# Patient Record
Sex: Female | Born: 1980
Health system: Southern US, Community
[De-identification: ages and names within clinical notes are randomized; demographics above are authoritative.]

## PROBLEM LIST (undated history)

## (undated) ENCOUNTER — Emergency Department (HOSPITAL_COMMUNITY): Admission: EM | Payer: Self-pay | Source: Home / Self Care

## (undated) DIAGNOSIS — O009 Unspecified ectopic pregnancy without intrauterine pregnancy: Secondary | ICD-10-CM

## (undated) DIAGNOSIS — J45909 Unspecified asthma, uncomplicated: Secondary | ICD-10-CM

## (undated) DIAGNOSIS — E669 Obesity, unspecified: Secondary | ICD-10-CM

## (undated) HISTORY — PX: SALPINGECTOMY: SHX328

## (undated) HISTORY — DX: Obesity, unspecified: E66.9

---

## 2011-03-27 DIAGNOSIS — O009 Unspecified ectopic pregnancy without intrauterine pregnancy: Secondary | ICD-10-CM

## 2011-03-27 HISTORY — DX: Unspecified ectopic pregnancy without intrauterine pregnancy: O00.90

## 2013-02-05 ENCOUNTER — Emergency Department (HOSPITAL_COMMUNITY)
Admission: EM | Admit: 2013-02-05 | Discharge: 2013-02-05 | Disposition: A | Discharge status: Home / Self Care | Payer: No Typology Code available for payment source | Attending: Emergency Medicine | Admitting: Emergency Medicine

## 2013-02-05 ENCOUNTER — Encounter (HOSPITAL_COMMUNITY): Payer: Self-pay | Admitting: Emergency Medicine

## 2013-02-05 ENCOUNTER — Emergency Department (HOSPITAL_COMMUNITY): Payer: No Typology Code available for payment source

## 2013-02-05 DIAGNOSIS — J45909 Unspecified asthma, uncomplicated: Secondary | ICD-10-CM | POA: Insufficient documentation

## 2013-02-05 DIAGNOSIS — Y92410 Unspecified street and highway as the place of occurrence of the external cause: Secondary | ICD-10-CM | POA: Insufficient documentation

## 2013-02-05 DIAGNOSIS — Y9389 Activity, other specified: Secondary | ICD-10-CM | POA: Insufficient documentation

## 2013-02-05 DIAGNOSIS — S0990XA Unspecified injury of head, initial encounter: Secondary | ICD-10-CM | POA: Insufficient documentation

## 2013-02-05 DIAGNOSIS — R519 Headache, unspecified: Secondary | ICD-10-CM

## 2013-02-05 HISTORY — DX: Unspecified asthma, uncomplicated: J45.909

## 2013-02-05 MED ORDER — IBUPROFEN 600 MG PO TABS: 600.0000 mg | ORAL_TABLET | Freq: Four times a day (QID) | ORAL | Status: DC | PRN

## 2013-02-05 MED ORDER — IBUPROFEN 400 MG PO TABS
600.0000 mg | ORAL_TABLET | Freq: Once | ORAL | Status: AC
  Administered 2013-02-05: 600 mg via ORAL
  Filled 2013-02-05 (×2): qty 1
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
## 2013-02-09 ENCOUNTER — Emergency Department (HOSPITAL_COMMUNITY): Payer: No Typology Code available for payment source

## 2013-02-09 ENCOUNTER — Encounter (HOSPITAL_COMMUNITY): Payer: Self-pay | Admitting: Emergency Medicine

## 2013-02-09 ENCOUNTER — Emergency Department (HOSPITAL_COMMUNITY)
Admission: EM | Admit: 2013-02-09 | Discharge: 2013-02-09 | Disposition: A | Discharge status: Home / Self Care | Payer: No Typology Code available for payment source | Attending: Emergency Medicine | Admitting: Emergency Medicine

## 2013-02-09 DIAGNOSIS — R519 Headache, unspecified: Secondary | ICD-10-CM

## 2013-02-09 DIAGNOSIS — M542 Cervicalgia: Secondary | ICD-10-CM | POA: Insufficient documentation

## 2013-02-09 DIAGNOSIS — Z3202 Encounter for pregnancy test, result negative: Secondary | ICD-10-CM | POA: Insufficient documentation

## 2013-02-09 DIAGNOSIS — J45909 Unspecified asthma, uncomplicated: Secondary | ICD-10-CM | POA: Insufficient documentation

## 2013-02-09 DIAGNOSIS — R112 Nausea with vomiting, unspecified: Secondary | ICD-10-CM | POA: Insufficient documentation

## 2013-02-09 DIAGNOSIS — M549 Dorsalgia, unspecified: Secondary | ICD-10-CM | POA: Insufficient documentation

## 2013-02-09 DIAGNOSIS — G8911 Acute pain due to trauma: Secondary | ICD-10-CM | POA: Insufficient documentation

## 2013-02-09 DIAGNOSIS — R51 Headache: Secondary | ICD-10-CM | POA: Insufficient documentation

## 2013-02-09 LAB — URINALYSIS, ROUTINE W REFLEX MICROSCOPIC
Glucose, UA: NEGATIVE mg/dL
Ketones, ur: NEGATIVE mg/dL
Leukocytes, UA: NEGATIVE
Protein, ur: NEGATIVE mg/dL
Urobilinogen, UA: 1 mg/dL (ref 0.0–1.0)

## 2013-02-09 MED ORDER — HYDROCODONE-ACETAMINOPHEN 5-325 MG PO TABS: 1.0000 | ORAL_TABLET | ORAL | Status: DC | PRN

## 2013-02-09 NOTE — ED Notes (Signed)
Pt states she has been vomiting and feels dizzy all day.
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
## 2013-02-10 NOTE — ED Provider Notes (Signed)
Medical screening examination/treatment/procedure(s) were performed by non-physician practitioner and as supervising physician I was immediately available for consultation/collaboration.  EKG Interpretation   None        Gilda Crease, MD 02/10/13 [phone]

## 2013-02-11 NOTE — ED Provider Notes (Signed)
Medical screening examination/treatment/procedure(s) were performed by non-physician practitioner and as supervising physician I was immediately available for consultation/collaboration.  EKG Interpretation   None         Keiandre Cygan L Mitchelle Goerner, MD 02/11/13 2239 

## 2013-03-14 ENCOUNTER — Emergency Department (HOSPITAL_COMMUNITY)
Admission: EM | Admit: 2013-03-14 | Discharge: 2013-03-14 | Disposition: A | Discharge status: Home / Self Care | Payer: No Typology Code available for payment source | Attending: Emergency Medicine | Admitting: Emergency Medicine

## 2013-03-14 ENCOUNTER — Encounter (HOSPITAL_COMMUNITY): Payer: Self-pay | Admitting: Emergency Medicine

## 2013-03-14 DIAGNOSIS — R1031 Right lower quadrant pain: Secondary | ICD-10-CM | POA: Insufficient documentation

## 2013-03-14 DIAGNOSIS — Z3202 Encounter for pregnancy test, result negative: Secondary | ICD-10-CM | POA: Insufficient documentation

## 2013-03-14 DIAGNOSIS — Z888 Allergy status to other drugs, medicaments and biological substances status: Secondary | ICD-10-CM | POA: Insufficient documentation

## 2013-03-14 DIAGNOSIS — N938 Other specified abnormal uterine and vaginal bleeding: Secondary | ICD-10-CM | POA: Insufficient documentation

## 2013-03-14 DIAGNOSIS — Z8742 Personal history of other diseases of the female genital tract: Secondary | ICD-10-CM | POA: Insufficient documentation

## 2013-03-14 DIAGNOSIS — N949 Unspecified condition associated with female genital organs and menstrual cycle: Secondary | ICD-10-CM | POA: Insufficient documentation

## 2013-03-14 DIAGNOSIS — J45909 Unspecified asthma, uncomplicated: Secondary | ICD-10-CM | POA: Insufficient documentation

## 2013-03-14 DIAGNOSIS — Z79899 Other long term (current) drug therapy: Secondary | ICD-10-CM | POA: Insufficient documentation

## 2013-03-14 DIAGNOSIS — R109 Unspecified abdominal pain: Secondary | ICD-10-CM

## 2013-03-14 DIAGNOSIS — I10 Essential (primary) hypertension: Secondary | ICD-10-CM | POA: Insufficient documentation

## 2013-03-14 DIAGNOSIS — Z87891 Personal history of nicotine dependence: Secondary | ICD-10-CM | POA: Insufficient documentation

## 2013-03-14 LAB — COMPREHENSIVE METABOLIC PANEL
ALT: 21 U/L (ref 0–35)
AST: 19 U/L (ref 0–37)
Albumin: 3.4 g/dL — ABNORMAL LOW (ref 3.5–5.2)
BUN: 14 mg/dL (ref 6–23)
CO2: 25 mEq/L (ref 19–32)
Chloride: 104 mEq/L (ref 96–112)
Creatinine, Ser: 0.78 mg/dL (ref 0.50–1.10)
GFR calc non Af Amer: >90 mL/min (ref >90)
Sodium: 138 mEq/L (ref 135–145)
Total Protein: 7.4 g/dL (ref 6.0–8.3)

## 2013-03-14 LAB — CBC WITH DIFFERENTIAL/PLATELET
Basophils Absolute: 0 10*3/uL (ref 0.0–0.1)
Basophils Relative: 0 % (ref 0–1)
Eosinophils Absolute: 0.7 10*3/uL (ref 0.0–0.7)
Hemoglobin: 12.6 g/dL (ref 12.0–15.0)
MCH: 28.9 pg (ref 26.0–34.0)
MCHC: 33 g/dL (ref 30.0–36.0)
Monocytes Absolute: 0.4 10*3/uL (ref 0.1–1.0)
Monocytes Relative: 5 % (ref 3–12)
Neutro Abs: 4.4 10*3/uL (ref 1.7–7.7)
Neutrophils Relative %: 52 % (ref 43–77)
RDW: 12.5 % (ref 11.5–15.5)
WBC: 8.5 10*3/uL (ref 4.0–10.5)

## 2013-03-14 LAB — URINALYSIS, ROUTINE W REFLEX MICROSCOPIC
Glucose, UA: NEGATIVE mg/dL
Hgb urine dipstick: NEGATIVE
Leukocytes, UA: NEGATIVE
Protein, ur: NEGATIVE mg/dL
Specific Gravity, Urine: 1.039 — ABNORMAL HIGH (ref 1.005–1.030)
pH: 6 (ref 5.0–8.0)

## 2013-03-14 LAB — POCT PREGNANCY, URINE: Preg Test, Ur: NEGATIVE

## 2013-03-14 NOTE — ED Notes (Signed)
Pt states she is feeling  Better and is ready to go home

## 2013-03-14 NOTE — ED Provider Notes (Signed)
Plan of right lower quadrant pain onset tonight. Pain resolved spontaneously without treatment. She is presently asymptomatic  Doug Sou, MD 03/14/13 2322

## 2013-03-14 NOTE — ED Provider Notes (Signed)
CSN: [id number]     Arrival date & time 03/14/13  2012 History   First MD Initiated Contact with Patient 03/14/13 2204     Chief Complaint  Patient presents with  . Abdominal Pain   Patient is a 32 y.o. female presenting with abdominal pain.  Abdominal Pain Pain location:  RLQ Pain quality: aching   Pain radiates to:  Does not radiate Pain severity:  Severe Onset quality:  Sudden Duration:  3 hours Timing:  Constant Progression:  Resolved Chronicity:  New Relieved by:  Nothing Worsened by:  Nothing tried Associated symptoms: no chills, no diarrhea, no dysuria, no fever, no nausea and no vomiting   Associated symptoms comment:  None Risk factors comment:  History of ectopic pregnancy  Past Medical History  Diagnosis Date  . Asthma    Past Surgical History  Procedure Laterality Date  . Salpingectomy     History reviewed. No pertinent family history. History  Substance Use Topics  . Smoking status: Former Games developer  . Smokeless tobacco: Never Used  . Alcohol Use: No   OB History   Grav Para Term Preterm Abortions TAB SAB Ect Mult Living                 Review of Systems  Constitutional: Negative for fever and chills.  Gastrointestinal: Positive for abdominal pain. Negative for nausea, vomiting and diarrhea.  Genitourinary: Negative for dysuria and frequency.  All other systems reviewed and are negative.   Allergies  Shellfish allergy and Iodine  Home Medications   Current Outpatient Rx  Name  Route  Sig  Dispense  Refill  . acetaminophen (TYLENOL) 500 MG tablet   Oral   Take 1,000 mg by mouth every 6 (six) hours as needed for mild pain.         Marland Kitchen albuterol (PROVENTIL HFA;VENTOLIN HFA) 108 (90 BASE) MCG/ACT inhaler   Inhalation   Inhale 2 puffs into the lungs every 4 (four) hours as needed for wheezing or shortness of breath.         Marland Kitchen ibuprofen (ADVIL,MOTRIN) 600 MG tablet   Oral   Take 600 mg by mouth every 6 (six) hours as needed for moderate  pain.         Marland Kitchen OVER THE COUNTER MEDICATION   Oral   Take 1 tablet by mouth daily. "Hairfinity" hair skin and nails vitamin          BP 126/83  Pulse 82  Temp(Src) 97.5 F (36.4 C) (Oral)  Resp 18  SpO2 99%  LMP 03/14/2013 Physical Exam  Nursing note and vitals reviewed. Constitutional: She is oriented to person, place, and time. She appears well-developed and well-nourished. No distress.  HENT:  Head: Normocephalic and atraumatic.  Eyes: Conjunctivae are normal. Pupils are equal, round, and reactive to light.  Neck: Normal range of motion. Neck supple.  Cardiovascular: Normal rate and regular rhythm.  Exam reveals no gallop and no friction rub.   No murmur heard. Pulmonary/Chest: Effort normal and breath sounds normal.  Abdominal: Soft. She exhibits no distension. There is no tenderness.  Musculoskeletal: Normal range of motion. She exhibits no edema and no tenderness.  Neurological: She is alert and oriented to person, place, and time. She has normal strength and normal reflexes. No cranial nerve deficit or sensory deficit.  Skin: Skin is warm and dry.  Psychiatric: She has a normal mood and affect.   ED Course  Procedures (including critical care time) Labs Review Labs Reviewed  CBC WITH DIFFERENTIAL - Abnormal; Notable for the following:    Eosinophils Relative 9 (*)    All other components within normal limits  COMPREHENSIVE METABOLIC PANEL - Abnormal; Notable for the following:    Glucose, Bld 109 (*)    Albumin 3.4 (*)    All other components within normal limits  URINALYSIS, ROUTINE W REFLEX MICROSCOPIC - Abnormal; Notable for the following:    Specific Gravity, Urine 1.039 (*)    All other components within normal limits  POCT PREGNANCY, URINE   Imaging Review No results found.  EKG Interpretation   None      MDM   1. Abdominal pain     Presents with cc of abdominal pain that lasted for a few hours. Denies pain currently. Vital normal other than  hypertensive. Benign abdominal exam. Currently on menses but denies any additional pelvic complaints. Declined pelvic. UPT and UA normal. With no pain currently and benign exam doubt acute intraabdominal or pelvic emergency. Follow up with pcp for blood pressure recheck. Return precautions given and discussed with the patient who was in agreement with the plan.    Shanon Ace, MD 03/15/13 [phone]

## 2013-03-14 NOTE — ED Notes (Signed)
Pt states she is currently on her menstrual cycle.

## 2013-03-14 NOTE — ED Notes (Signed)
Pt states lower right Quadrant abdominal cramping, with nausea. Denies vomiting LMP 03/14/2013, LBM 03/14/13

## 2013-03-15 NOTE — ED Provider Notes (Signed)
I have personally seen and examined the patient.  I have discussed the plan of care with the resident.  I have reviewed the documentation on PMH/FH/Soc. History.  I have reviewed the documentation of the resident and agree.  Doug Sou, MD 03/15/13 [phone]

## 2013-06-07 ENCOUNTER — Emergency Department (HOSPITAL_COMMUNITY): Payer: No Typology Code available for payment source

## 2013-06-07 ENCOUNTER — Encounter (HOSPITAL_COMMUNITY): Payer: Self-pay | Admitting: Emergency Medicine

## 2013-06-07 ENCOUNTER — Emergency Department (HOSPITAL_COMMUNITY)
Admission: EM | Admit: 2013-06-07 | Discharge: 2013-06-07 | Disposition: A | Discharge status: Home / Self Care | Payer: Self-pay | Attending: Emergency Medicine | Admitting: Emergency Medicine

## 2013-06-07 DIAGNOSIS — R110 Nausea: Secondary | ICD-10-CM | POA: Insufficient documentation

## 2013-06-07 DIAGNOSIS — Z9079 Acquired absence of other genital organ(s): Secondary | ICD-10-CM | POA: Insufficient documentation

## 2013-06-07 DIAGNOSIS — Z79899 Other long term (current) drug therapy: Secondary | ICD-10-CM | POA: Insufficient documentation

## 2013-06-07 DIAGNOSIS — R1031 Right lower quadrant pain: Secondary | ICD-10-CM | POA: Insufficient documentation

## 2013-06-07 DIAGNOSIS — R197 Diarrhea, unspecified: Secondary | ICD-10-CM | POA: Insufficient documentation

## 2013-06-07 DIAGNOSIS — Z3202 Encounter for pregnancy test, result negative: Secondary | ICD-10-CM | POA: Insufficient documentation

## 2013-06-07 DIAGNOSIS — Z8619 Personal history of other infectious and parasitic diseases: Secondary | ICD-10-CM | POA: Insufficient documentation

## 2013-06-07 DIAGNOSIS — Z87891 Personal history of nicotine dependence: Secondary | ICD-10-CM | POA: Insufficient documentation

## 2013-06-07 DIAGNOSIS — R109 Unspecified abdominal pain: Secondary | ICD-10-CM

## 2013-06-07 DIAGNOSIS — J45909 Unspecified asthma, uncomplicated: Secondary | ICD-10-CM | POA: Insufficient documentation

## 2013-06-07 LAB — URINALYSIS, ROUTINE W REFLEX MICROSCOPIC
Bilirubin Urine: NEGATIVE
Glucose, UA: NEGATIVE mg/dL
Hgb urine dipstick: NEGATIVE
Ketones, ur: NEGATIVE mg/dL
Leukocytes, UA: NEGATIVE
NITRITE: NEGATIVE
PH: 5.5 (ref 5.0–8.0)
Protein, ur: NEGATIVE mg/dL
Specific Gravity, Urine: 1.029 (ref 1.005–1.030)
UROBILINOGEN UA: 0.2 mg/dL (ref 0.0–1.0)

## 2013-06-07 LAB — CBC WITH DIFFERENTIAL/PLATELET
BASOS ABS: 0 10*3/uL (ref 0.0–0.1)
Basophils Relative: 0 % (ref 0–1)
EOS ABS: 0.3 10*3/uL (ref 0.0–0.7)
Eosinophils Relative: 4 % (ref 0–5)
HEMATOCRIT: 38.2 % (ref 36.0–46.0)
Hemoglobin: 12.5 g/dL (ref 12.0–15.0)
Lymphocytes Relative: 31 % (ref 12–46)
Lymphs Abs: 1.9 10*3/uL (ref 0.7–4.0)
MCH: 28.8 pg (ref 26.0–34.0)
MCHC: 32.7 g/dL (ref 30.0–36.0)
MCV: 88 fL (ref 78.0–100.0)
MONO ABS: 0.5 10*3/uL (ref 0.1–1.0)
Monocytes Relative: 7 % (ref 3–12)
Neutro Abs: 3.5 10*3/uL (ref 1.7–7.7)
Neutrophils Relative %: 57 % (ref 43–77)
PLATELETS: 242 10*3/uL (ref 150–400)
RBC: 4.34 MIL/uL (ref 3.87–5.11)
RDW: 12.7 % (ref 11.5–15.5)
WBC: 6.2 10*3/uL (ref 4.0–10.5)

## 2013-06-07 LAB — COMPREHENSIVE METABOLIC PANEL
ALT: 21 U/L (ref 0–35)
AST: 21 U/L (ref 0–37)
Albumin: 3.3 g/dL — ABNORMAL LOW (ref 3.5–5.2)
Alkaline Phosphatase: 52 U/L (ref 39–117)
BUN: 14 mg/dL (ref 6–23)
CALCIUM: 9 mg/dL (ref 8.4–10.5)
CO2: 21 meq/L (ref 19–32)
Chloride: 105 mEq/L (ref 96–112)
Creatinine, Ser: 0.7 mg/dL (ref 0.50–1.10)
GLUCOSE: 95 mg/dL (ref 70–99)
Potassium: 4.5 mEq/L (ref 3.7–5.3)
SODIUM: 139 meq/L (ref 137–147)
TOTAL PROTEIN: 7.2 g/dL (ref 6.0–8.3)
Total Bilirubin: 0.4 mg/dL (ref 0.3–1.2)

## 2013-06-07 LAB — WET PREP, GENITAL
TRICH WET PREP: NONE SEEN
YEAST WET PREP: NONE SEEN

## 2013-06-07 LAB — HIV ANTIBODY (ROUTINE TESTING W REFLEX): HIV: NONREACTIVE

## 2013-06-07 LAB — PREGNANCY, URINE: PREG TEST UR: NEGATIVE

## 2013-06-07 LAB — RPR: RPR Ser Ql: NONREACTIVE

## 2013-06-07 MED ORDER — ONDANSETRON HCL 4 MG/2ML IJ SOLN
4.0000 mg | Freq: Once | INTRAMUSCULAR | Status: AC
  Administered 2013-06-07: 4 mg via INTRAVENOUS
  Filled 2013-06-07: qty 2

## 2013-06-07 MED ORDER — MORPHINE SULFATE 4 MG/ML IJ SOLN
4.0000 mg | Freq: Once | INTRAMUSCULAR | Status: AC
Start: 2013-06-07 — End: 2013-06-07
  Administered 2013-06-07: 4 mg via INTRAVENOUS
  Filled 2013-06-07: qty 1

## 2013-06-07 NOTE — ED Provider Notes (Signed)
CSN: [id number]     Arrival date & time 06/07/13  0917 History   None    Chief Complaint  Patient presents with  . Abdominal Pain  . Nausea    HPI  Susan Faulkner is a 33 y.o. female with a PMH of asthma who presents to the ED for evaluation of abdominal pain and nausea. History was provided by the patient. Patient states that she has had ongoing unchanged intermittent RLQ pain for the past year. She states that it all started after an ectopic pregnancy x 2 (left and right). She states that she had a salpingectomy but cannot remember which side. She states that her pain started again last night and has continued throughout the day today. Pain is described as a sharp pain. Associated symptoms include nausea without emesis. Has also had loose brown stools. No hematochezia. Patient has [place] in [place] but missed her last appointment because her pain improved. [place] and is looking to establish a new provider. Has a hx of ovarian cysts. Patient denies any dysuria, hematuria. No back pain. She is currently on her menstrual period. Has had mild white vaginal discharge. Is currently sexually active with no new sexual partners. Hx of STD with gonorrhea and chlamydia years ago. No fevers, chills, change in appetite/activity.    Past Medical History  Diagnosis Date  . Asthma    Past Surgical History  Procedure Laterality Date  . Salpingectomy     No family history on file. History  Substance Use Topics  . Smoking status: Former Games developermoker  . Smokeless tobacco: Never Used  . Alcohol Use: No   OB History   Grav Para Term Preterm Abortions TAB SAB Ect Mult Living                 Review of Systems  Constitutional: Negative for fever, chills, diaphoresis, activity change, appetite change and fatigue.  Respiratory: Negative for cough and shortness of breath.   Cardiovascular: Negative for chest pain and leg swelling.  Gastrointestinal: Positive for  nausea, abdominal pain and diarrhea. Negative for vomiting, constipation, blood in stool and anal bleeding.  Genitourinary: Positive for vaginal bleeding (menses) and vaginal discharge. Negative for dysuria, hematuria, flank pain, decreased urine volume, difficulty urinating, genital sores, menstrual problem and pelvic pain.  Musculoskeletal: Negative for back pain and myalgias.  Neurological: Negative for dizziness, weakness, light-headedness and headaches.     Allergies  Shellfish allergy and Iodine  Home Medications   Current Outpatient Rx  Name  Route  Sig  Dispense  Refill  . acetaminophen (TYLENOL) 500 MG tablet   Oral   Take 1,000 mg by mouth every 6 (six) hours as needed for mild pain.         Marland Kitchen. albuterol (PROVENTIL HFA;VENTOLIN HFA) 108 (90 BASE) MCG/ACT inhaler   Inhalation   Inhale 2 puffs into the lungs every 4 (four) hours as needed for wheezing or shortness of breath.          BP 121/78  Pulse 67  Temp(Src) 97.3 F (36.3 C) (Oral)  Resp 18  SpO2 95%  Filed Vitals:   06/07/13 0955 06/07/13 1257 06/07/13 1336  BP: 121/78 122/77 105/65  Pulse: 67 79 63  Temp: 97.3 F (36.3 C)    TempSrc: Oral    Resp: 18 18 18   SpO2: 95% 97% 97%     Physical Exam  Nursing note and vitals reviewed. Constitutional: She is oriented  to person, place, and time. She appears well-developed and well-nourished. No distress.  HENT:  Head: Normocephalic and atraumatic.  Right Ear: External ear normal.  Left Ear: External ear normal.  Mouth/Throat: Oropharynx is clear and moist.  Eyes: Conjunctivae are normal. Right eye exhibits no discharge. Left eye exhibits no discharge.  Neck: Normal range of motion. Neck supple.  Cardiovascular: Normal rate, regular rhythm and normal heart sounds.  Exam reveals no gallop and no friction rub.   No murmur heard. Pulmonary/Chest: Effort normal and breath sounds normal. No respiratory distress. She has no wheezes. She has no rales. She  exhibits no tenderness.  Abdominal: Soft. She exhibits no distension and no mass. There is tenderness. There is no rebound and no guarding.  RLQ tenderness to palpation. No guarding or peritoneal signs.   Musculoskeletal: Normal range of motion. She exhibits no edema and no tenderness.  No flank, CVA, or lumbar tenderness bilaterally  Neurological: She is alert and oriented to person, place, and time.  Skin: Skin is warm. She is not diaphoretic.    ED Course  Procedures (including critical care time) Labs Review Labs Reviewed  WET PREP, GENITAL  GC/CHLAMYDIA PROBE AMP  CBC WITH DIFFERENTIAL  COMPREHENSIVE METABOLIC PANEL  URINALYSIS, ROUTINE W REFLEX MICROSCOPIC  PREGNANCY, URINE  RPR  HIV ANTIBODY (ROUTINE TESTING)   Imaging Review No results found.   EKG Interpretation None      Results for orders placed during the hospital encounter of 06/07/13  WET PREP, GENITAL      Result Value Ref Range   Yeast Wet Prep HPF POC NONE SEEN  NONE SEEN   Trich, Wet Prep NONE SEEN  NONE SEEN   Clue Cells Wet Prep HPF POC FEW (*) NONE SEEN   WBC, Wet Prep HPF POC MODERATE (*) NONE SEEN  CBC WITH DIFFERENTIAL      Result Value Ref Range   WBC 6.2  4.0 - 10.5 K/uL   RBC 4.34  3.87 - 5.11 MIL/uL   Hemoglobin 12.5  12.0 - 15.0 g/dL   HCT 16.1  09.6 - 04.5 %   MCV 88.0  78.0 - 100.0 fL   MCH 28.8  26.0 - 34.0 pg   MCHC 32.7  30.0 - 36.0 g/dL   RDW 40.9  81.1 - 91.4 %   Platelets 242  150 - 400 K/uL   Neutrophils Relative % 57  43 - 77 %   Neutro Abs 3.5  1.7 - 7.7 K/uL   Lymphocytes Relative 31  12 - 46 %   Lymphs Abs 1.9  0.7 - 4.0 K/uL   Monocytes Relative 7  3 - 12 %   Monocytes Absolute 0.5  0.1 - 1.0 K/uL   Eosinophils Relative 4  0 - 5 %   Eosinophils Absolute 0.3  0.0 - 0.7 K/uL   Basophils Relative 0  0 - 1 %   Basophils Absolute 0.0  0.0 - 0.1 K/uL  COMPREHENSIVE METABOLIC PANEL      Result Value Ref Range   Sodium 139  137 - 147 mEq/L   Potassium 4.5  3.7 - 5.3 mEq/L    Chloride 105  96 - 112 mEq/L   CO2 21  19 - 32 mEq/L   Glucose, Bld 95  70 - 99 mg/dL   BUN 14  6 - 23 mg/dL   Creatinine, Ser 7.82  0.50 - 1.10 mg/dL   Calcium 9.0  8.4 - 95.6 mg/dL   Total Protein  7.2  6.0 - 8.3 g/dL   Albumin 3.3 (*) 3.5 - 5.2 g/dL   AST 21  0 - 37 U/L   ALT 21  0 - 35 U/L   Alkaline Phosphatase 52  39 - 117 U/L   Total Bilirubin 0.4  0.3 - 1.2 mg/dL   GFR calc non Af Amer >90  >90 mL/min   GFR calc Af Amer >90  >90 mL/min  URINALYSIS, ROUTINE W REFLEX MICROSCOPIC      Result Value Ref Range   Color, Urine YELLOW  YELLOW   APPearance CLEAR  CLEAR   Specific Gravity, Urine 1.029  1.005 - 1.030   pH 5.5  5.0 - 8.0   Glucose, UA NEGATIVE  NEGATIVE mg/dL   Hgb urine dipstick NEGATIVE  NEGATIVE   Bilirubin Urine NEGATIVE  NEGATIVE   Ketones, ur NEGATIVE  NEGATIVE mg/dL   Protein, ur NEGATIVE  NEGATIVE mg/dL   Urobilinogen, UA 0.2  0.0 - 1.0 mg/dL   Nitrite NEGATIVE  NEGATIVE   Leukocytes, UA NEGATIVE  NEGATIVE  PREGNANCY, URINE      Result Value Ref Range   Preg Test, Ur NEGATIVE  NEGATIVE    US Pelvis Complete (Final result)  Result time: 06/07/13 14:34:55    Final result by Rad Results In Interface (06/07/13 14:34:55)    Narrative:   CLINICAL DATA: Right-sided pelvic pain. LMP 05/31/2013.  EXAM: TRANSABDOMINAL AND TRANSVAGINAL ULTRASOUND OF PELVIS  DOPPLER ULTRASOUND OF OVARIES  TECHNIQUE: Both transabdominal and transvaginal ultrasound examinations of the pelvis were performed. Transabdominal technique was performed for global imaging of the pelvis including uterus, ovaries, adnexal regions, and pelvic cul-de-sac.  It was necessary to proceed with endovaginal exam following the transabdominal exam to visualize the endometrium and ovaries. Color and duplex Doppler ultrasound was utilized to evaluate blood flow to the ovaries.  COMPARISON: None.  FINDINGS: Uterus  Measurements: 7.3 x 4.0 x 4.6 cm. No fibroids or other  mass visualized.  Endometrium  Thickness: 6 mm. No focal abnormality visualized.  Right ovary  Measurements: 5.0 x 2.4 x 3.0 cm. Normal appearance/no adnexal mass.  Left ovary  Measurements: 4.0 x 2.3 x 2.2 cm. Normal appearance/no adnexal mass.  Pulsed Doppler evaluation of both ovaries demonstrates normal low-resistance arterial and venous waveforms.  Other findings  No free fluid.  IMPRESSION: Normal appearance of uterus and both ovaries. No pelvic mass or other significant abnormality identified.  No sonographic evidence for ovarian torsion.   Electronically Signed By: Myles Rosenthal M.D. On: 06/07/2013 14:34          MDM   Susan Faulkner is a 33 y.o. female with a PMH of asthma who presents to the ED for evaluation of abdominal pain and nausea.   Rechecks  11:45 AM = Pelvic exam at bedside with RN present. Pain much improved per patient. Minimal vaginal bleeding in the vaginal vault. No vaginal discharge. Right adnexal tenderness. No CMT or left adnexal tenderness.  12:16 PM = Pain 2/10. Patient on her phone in no acute distress.  3:00 PM = Abdominal pain resolved. Patient eating a [employer] burger and fries. Repeat abdominal exam benign.  3:32 PM = Pain resolved. States she is ready for discharge. No complaints.    Patient evaluated for chronic unchanged RLQ abdominal pain for the past year. Etiology of abdominal pain unclear. Possibly due to current menstrual cycle or adhesions from salpingectomy in the past. [place] negative for ovarian torsion or other acute abnormality. Pelvic exam unremarkable.  No concern for PID. Labs unremarkable. UA negative for UTI. Doubt appendicitis due to chronic unchanged pain and resolution of symptoms throughout ED visit. Strict appendicitis and return precautions given to patient. Encouraged to follow-up with PCP and OB/GYN. Patient afebrile and non-toxic in appearance. Vital signs stable. Return precautions, discharge instructions,  and follow-up was discussed with the patient before discharge.      Discharge Medication List as of 06/07/2013  3:42 PM       Final impressions: 1. Abdominal pain       Luiz Iron PA-C   This patient was discussed with Dr. Jyl Heinz, PA-C 06/07/13 1730

## 2013-06-07 NOTE — Discharge Instructions (Signed)
Drink fluids and rest  Take Ibuprofen or Tylenol as needed for pain  Follow-up with an OB/GYN and primary doctor  Please read appendicitis precautions below - return to the ED if you develop any of these!  Return to the emergency department if you develop any changing/worsening condition, repeated vomiting, fever, or any other concerns (please read additional information regarding your condition below)   Abdominal Pain, Adult Many things can cause abdominal pain. Usually, abdominal pain is not caused by a disease and will improve without treatment. It can often be observed and treated at home. Your health care provider will do a physical exam and possibly order blood tests and X-rays to help determine the seriousness of your pain. However, in many cases, more time must pass before a clear cause of the pain can be found. Before that point, your health care provider may not know if you need more testing or further treatment. HOME CARE INSTRUCTIONS  Monitor your abdominal pain for any changes. The following actions may help to alleviate any discomfort you are experiencing:  Only take over-the-counter or prescription medicines as directed by your health care provider.  Do not take laxatives unless directed to do so by your health care provider.  Try a clear liquid diet (broth, tea, or water) as directed by your health care provider. Slowly move to a bland diet as tolerated. SEEK MEDICAL CARE IF:  You have unexplained abdominal pain.  You have abdominal pain associated with nausea or diarrhea.  You have pain when you urinate or have a bowel movement.  You experience abdominal pain that wakes you in the night.  You have abdominal pain that is worsened or improved by eating food.  You have abdominal pain that is worsened with eating fatty foods. SEEK IMMEDIATE MEDICAL CARE IF:   Your pain does not go away within 2 hours.  You have a fever.  You keep throwing up (vomiting).  Your pain  is felt only in portions of the abdomen, such as the right side or the left lower portion of the abdomen.  You pass bloody or black tarry stools. MAKE SURE YOU:  Understand these instructions.   Will watch your condition.   Will get help right away if you are not doing well or get worse.  Document Released: 12/20/2004 Document Revised: 12/31/2012 Document Reviewed: 11/19/2012 [facility] Patient Information 2014 [employer] [place].  Appendicitis precautions - PLEASE READ!   SYMPTOMS   Pain around your belly button (navel) that moves toward your lower right belly (abdomen). The pain can become more severe and sharp as time passes.  Tenderness in the lower right abdomen. Pain gets worse if you cough or make a sudden movement.  Feeling sick to your stomach (nauseous).  Throwing up (vomiting).  Loss of appetite.  Fever.  Constipation.  Diarrhea.  Generally not feeling well.   Emergency Department Resource Guide 1) Find a Doctor and Pay Out of Pocket Although you won't have to find out who is covered by your insurance plan, it is a good idea to ask around and get recommendations. You will then need to call the office and see if the doctor you have chosen will accept you as a new patient and what types of options they offer for patients who are self-pay. Some doctors offer discounts or will set up payment plans for their patients who do not have insurance, but you will need to ask so you aren't surprised when you get to your appointment.  2)  Contact Your Local Health Department Not all health departments have doctors that can see patients for sick visits, but many do, so it is worth a call to see if yours does. If you don't know where your local health department is, you can check in your phone book. The CDC also has a tool to help you locate your state's health department, and many state websites also have listings of all of their local health departments.  3) Find a Walk-in  Clinic If your illness is not likely to be very severe or complicated, you may want to try a walk in clinic. These are popping up all over the country in pharmacies, drugstores, and shopping centers. They're usually staffed by nurse practitioners or physician assistants that have been trained to treat common illnesses and complaints. They're usually fairly quick and inexpensive. However, if you have serious medical issues or chronic medical problems, these are probably not your best option.  No Primary Care Doctor: - Call Health Connect at  [phone] - they can help you locate a primary care doctor that  accepts your insurance, provides certain services, etc. - Physician Referral Service- [phone]  Chronic Pain Problems: Organization         Address  Phone   Notes  [facility]  [phone] Patients need to be referred by their primary care doctor.   Medication Assistance: Organization         Address  Phone   Notes  [facility] Medication [facility] [address] [place] [place], [place] [address] [phone] --Must be a resident of [facility] -- Must have NO insurance coverage whatsoever (no Medicaid/ Medicare, etc.) -- The pt. MUST have a primary care doctor that directs their care regularly and follows them in the community   MedAssist  [phone]   [employer]  [phone]    Agencies that provide inexpensive medical care: Organization         Address  Phone   Notes  [facility]  [phone]   [facility]    [phone]   [facility] [address] [place], [place] [address] [phone]   [facility] [address] [place] [phone]   Planned Parenthood    [phone]   [facility]    [phone]   [facility]  [address], [place] Phone:  [phone], Fax:  [phone] Hours  of Operation:  9 am - 6 pm, M-F.  Also accepts Medicaid/Medicare and self-pay.  [facility]  [address], [place] Phone: [phone], Fax: [phone]. Hours of Operation:  8:30 am - 5:30 pm, M-F.  Also accepts Medicaid and self-pay.  [facility] [place] [address] [place] Phone: [phone]   [facility] [address] Natasha Bence [place], [place] [phone], Ext. [phone] Mondays & Thursdays: 7-9 AM.  First 15 patients are seen on a first come, first serve basis.    Medicaid-accepting [facility] Providers:  Organization         Address  Phone   Notes  [facility] [address], [place] [phone] Also accepts self-pay patients.  [facility] [address] Laurell Joseph[address], [place]  [phone]   [facility] [address],  [place] [phone]   [facility] [address], [place] [phone]   Renaye Rakers [address], [place]   [phone] Only accepts [place] Access [place] patients after they have their name applied to their card.   Self-Pay (no insurance) in [facility]:  Organization         Address  Phone   Notes  Sickle Cell Patients, [facility] [address] [place], [place] [phone]   [facility] [address] [place], [place] [phone]   [facility] [place]  [address], [place] [phone]   [facility]/Dr. Osei-Bonsu  [address] [place] or [address], [place] [phone] Phone number for both [place] and [place] locations is the same.  [facility] [address] [place] [phone]   [facility] [address], [place] or [address] [phone]   [facility] [address], [place] [phone], phone; [phone], fax Sees patients 1st and 3rd Saturday of every month.  Must not qualify for public or private insurance (i.e. Medicaid, Medicare, [place] Health Choice, Veterans' Benefits)  Household income should be no more than 200% of the poverty level The clinic cannot treat you if you are pregnant or think you are pregnant  Sexually transmitted diseases are not treated at the clinic.    Dental Care: Organization         Address  Phone  Notes  [facility] Department of [facility] [address] [place], [place] [phone] Accepts children up to age 25 who are enrolled in [place] or [place] Health Choice; pregnant women with a Medicaid card; and children who have applied for Medicaid or [place] Health Choice, but were declined, whose parents can pay a reduced fee at time of service.  [facility] Department of [facility]  [address], [place] [phone] Accepts children up to age 3 who are enrolled in [place] or [place] Health Choice; pregnant women with a Medicaid card; and children who have applied for Medicaid or [place] Health Choice, but were declined, whose parents can pay a reduced fee at time of service.  [facility]  [address] [place], [place] [phone] Patients are seen by appointment only. Walk-ins are not accepted. [facility] will see patients 54 years of age and older. Monday - Tuesday (8am-5pm) Most Wednesdays (8:30-5pm) $30 per visit, cash only  [facility] Adult Dental Access PRO[facility]  [address], [facility] [phone] Patients are seen by appointment only. Walk-ins are not accepted. [facility] will see patients 14 years of age and older. One Wednesday Evening (Monthly: Volunteer Based).  $30 per visit, cash only  [employer]  [phone] for adults; Children under age 70, call Graduate  Pediatric Dentistry at [phone]. Children aged 17-14, please call [phone] to request a pediatric application.  Dental services are provided in all areas of dental care including fillings, crowns and bridges, complete and partial dentures, implants, gum treatment, root canals, and extractions. Preventive care is also provided. Treatment is provided to both adults and children. Patients are selected via a lottery and there is often a waiting list.   [facility] [address], [place] Otto  [phone] [email or web address]   [facility] [address] [place], [place] [phone], Ext. [phone] Second and Fourth Thursday of each month, opens at 6:30 AM; Clinic ends at 9 AM.  Patients are seen on a first-come first-served basis, and a limited number are seen during each clinic.   [facility]  [address] Hillard Danker [place], [place] [phone]   Eligibility Requirements You must have lived in [place], [place], or [place] counties for at least the last three months.   You cannot be eligible for state or federal sponsored [employer], including [employer], [place], or [employer].   You generally cannot be eligible for healthcare insurance through your employer.    How to apply: Eligibility screenings are held every Tuesday and Wednesday afternoon from 1:00 pm until 4:00 pm. You do not need an appointment for the interview!  [facility] [address], [place], [place]   [place]  [place] Department  [place]  [phone]    Behavioral Health Resources in the Community: Intensive Outpatient Programs Organization         Address  Phone  Notes  [place] [place]. [address] [place], [place] [phone]   [facility] [address], [place], [place]   [facility] [address] [place] [address], [place], [place]   [place] [address]  [place], [place] or [phone]   Substance Abuse Resources Organization         Address  Phone  Notes  [facility] Drug Services  [phone]   [place]  [phone]   [place]   Chinita Pester  [phone]   Residential & Outpatient Substance Abuse Program  [phone]   Psychological Services Organization         Address  Phone  Notes  [facility] [place]  [place]  [phone]   [place] [address], [place] or [phone]    Mobile Crisis Teams Organization         Address  Phone  Notes  [facility], Mobile Crisis Care Unit  [phone]   [facility]  [address] [place], [place]   [place] Levels [address], [place] [phone]    Self-Help/Support Groups Organization         Address  Phone             Notes  [place]. of [place] - variety of support groups  [place] Call for more information  Narcotics Anonymous (NA), [facility] [address], [employer] [place]  2 meetings at this location   Special educational needs teacher         Address  Phone  Notes  [facility] [place],    [place]  [phone]-([phone]   [facility]  [address], [place] [address], [place], [place]   [place], [place] [phone] Admissions: 8am-3pm M-F  Incentives Substance [place] [address]    [place], [place] [phone]   [facility] [address] Jadene Pierini [place], [place]   [facility] [address]  [place], [place]   [facility] -  Intensive Outpatient [address] Laurell Josephs [address], [place], [place] [phone]   [facility] [address]  [place], [place] [phone] or [phone]   [facility] ([facility]) [address] [place], [place] [phone] Accepts Medicaid  [facility] [address]  [place] [phone] Substance Abuse/Addiction Treatment   [facility] Organization         Address  Phone  Notes  [facility]  [phone]   Angie Fava, PhD [address] Ervin Knack [place], [place]   [phone] or [phone]   [facility]   [address] [place], [place] [phone]   [facility] [address] [place], [place] [phone] Insurance/Medicaid/sponsorship through [facility] and Families [address]                                    [place], [place] [phone] Therapy/tele-psych/case  [facility] [address], [place] [phone]    Dr. Lolly Mustache  [phone]   [facility] [place]  [employer] [facility] Dept. 1) [address], [place] 2) [address] [place] 3)  [address] [place] [address], [place] [phone]  [phone]   [facility] Child Abuse Hotline 9780936465 or [phone] (After Hours)

## 2013-06-07 NOTE — ED Notes (Signed)
Pt presents to department for evaluation of lower abdominal pain and nausea. Ongoing for several weeks. 9/10 pain upon arrival. Denies urinary symptoms. Denies vaginal symptoms. Pt is conscious alert and oriented x4.

## 2013-06-07 NOTE — ED Provider Notes (Signed)
Medical screening examination/treatment/procedure(s) were performed by non-physician practitioner and as supervising physician I was immediately available for consultation/collaboration.   EKG Interpretation None        Susan PoundMichael Y. Tamiya Colello, MD 06/07/13 2210

## 2013-06-07 NOTE — ED Notes (Signed)
PA at bedside performing pelvic exam without difficulty. Pt tolerating without issues.

## 2013-06-08 ENCOUNTER — Emergency Department (HOSPITAL_COMMUNITY): Payer: Self-pay

## 2013-06-08 ENCOUNTER — Emergency Department (HOSPITAL_COMMUNITY)
Admission: EM | Admit: 2013-06-08 | Discharge: 2013-06-08 | Disposition: A | Discharge status: Home / Self Care | Payer: Self-pay | Attending: Emergency Medicine | Admitting: Emergency Medicine

## 2013-06-08 ENCOUNTER — Encounter (HOSPITAL_COMMUNITY): Payer: Self-pay | Admitting: Emergency Medicine

## 2013-06-08 DIAGNOSIS — Z9079 Acquired absence of other genital organ(s): Secondary | ICD-10-CM | POA: Insufficient documentation

## 2013-06-08 DIAGNOSIS — R109 Unspecified abdominal pain: Secondary | ICD-10-CM

## 2013-06-08 DIAGNOSIS — Z87891 Personal history of nicotine dependence: Secondary | ICD-10-CM | POA: Insufficient documentation

## 2013-06-08 DIAGNOSIS — G8929 Other chronic pain: Secondary | ICD-10-CM | POA: Insufficient documentation

## 2013-06-08 DIAGNOSIS — Z79899 Other long term (current) drug therapy: Secondary | ICD-10-CM | POA: Insufficient documentation

## 2013-06-08 DIAGNOSIS — R1031 Right lower quadrant pain: Secondary | ICD-10-CM | POA: Insufficient documentation

## 2013-06-08 DIAGNOSIS — J45909 Unspecified asthma, uncomplicated: Secondary | ICD-10-CM | POA: Insufficient documentation

## 2013-06-08 DIAGNOSIS — E669 Obesity, unspecified: Secondary | ICD-10-CM | POA: Insufficient documentation

## 2013-06-08 DIAGNOSIS — R110 Nausea: Secondary | ICD-10-CM | POA: Insufficient documentation

## 2013-06-08 LAB — I-STAT CHEM 8, ED
BUN: 13 mg/dL (ref 6–23)
CALCIUM ION: 1.21 mmol/L (ref 1.12–1.23)
CHLORIDE: 106 meq/L (ref 96–112)
CREATININE: 0.8 mg/dL (ref 0.50–1.10)
GLUCOSE: 102 mg/dL — AB (ref 70–99)
HCT: 38 % (ref 36.0–46.0)
Hemoglobin: 12.9 g/dL (ref 12.0–15.0)
Potassium: 4 mEq/L (ref 3.7–5.3)
Sodium: 142 mEq/L (ref 137–147)
TCO2: 22 mmol/L (ref 0–100)

## 2013-06-08 LAB — GC/CHLAMYDIA PROBE AMP
CT Probe RNA: NEGATIVE
GC Probe RNA: NEGATIVE

## 2013-06-08 MED ORDER — IOHEXOL 300 MG/ML  SOLN
25.0000 mL | INTRAMUSCULAR | Status: AC
  Administered 2013-06-08: 25 mL via ORAL

## 2013-06-08 MED ORDER — KETOROLAC TROMETHAMINE 15 MG/ML IJ SOLN
15.0000 mg | Freq: Once | INTRAMUSCULAR | Status: AC
  Administered 2013-06-08: 15 mg via INTRAVENOUS
  Filled 2013-06-08: qty 1

## 2013-06-08 MED ORDER — IOHEXOL 300 MG/ML  SOLN
100.0000 mL | Freq: Once | INTRAMUSCULAR | Status: AC | PRN
Start: 2013-06-08 — End: 2013-06-08
  Administered 2013-06-08: 100 mL via INTRAVENOUS

## 2013-06-08 NOTE — ED Notes (Signed)
Patient transported to CT 

## 2013-06-08 NOTE — ED Notes (Signed)
Patient back from x-ray 

## 2013-06-08 NOTE — ED Provider Notes (Signed)
CSN: [id number]     Arrival date & time 06/08/13  0806 History   First MD Initiated Contact with Patient 06/08/13 [phone]     Chief Complaint  Patient presents with  . Abdominal Pain    Patient is a 33 y.o. female presenting with abdominal pain.  Abdominal Pain Pain location:  RLQ Pain quality: cramping   Pain radiates to:  Does not radiate Pain severity:  Moderate Onset quality:  Gradual Duration: she reports pain of identical quality and location for one year; it is different today only in that it has been long lasting over 2 days, whereas usually it resolves after a few minutes to hours. Timing:  Constant Progression:  Improving Chronicity:  Chronic Context: not alcohol use, not recent illness and not trauma   Relieved by:  Nothing Exacerbated by: nothing. Ineffective treatments:  None tried Associated symptoms: nausea and vaginal bleeding (menses)   Associated symptoms: no anorexia, no chest pain, no chills, no constipation, no cough, no diarrhea, no dysuria, no fever, no hematemesis, no hematochezia, no hematuria, no melena, no shortness of breath, no vaginal discharge and no vomiting   Vaginal bleeding:    Progression: she started her LMP about 5 days ago, and states her bleeding is minimal now, spotting, as if her menstrual period is ending. Risk factors: obesity   Risk factors: no alcohol abuse   Risk factors comment:  She has had two prior surgeries for ruptured ectopic pregnancies  She was seen here for the same yesterday.  At that time, she had lab evaluation with essentially normal CBC and CMP.  Urine studies not suggestive of UTI.  Pelvic exam documented essentially as normal.  Ultrasound of her pelvis revealed normal ovaries and reproductive organs, with no ectopic pregnancy, torsion, or mass identified.  Pregnancy test was negative.  She was ultimately discharged home and given return precautions, but has not improved in the interim.  Past Medical History  Diagnosis Date   . Asthma    Past Surgical History  Procedure Laterality Date  . Salpingectomy     History reviewed. No pertinent family history. History  Substance Use Topics  . Smoking status: Former Games developer  . Smokeless tobacco: Never Used  . Alcohol Use: No   OB History   Grav Para Term Preterm Abortions TAB SAB Ect Mult Living                 Review of Systems  Constitutional: Negative for fever, chills and diaphoresis.  HENT: Negative for congestion and rhinorrhea.   Respiratory: Negative for cough, shortness of breath and wheezing.   Cardiovascular: Negative for chest pain and leg swelling.  Gastrointestinal: Positive for nausea and abdominal pain. Negative for vomiting, diarrhea, constipation, melena, hematochezia, anorexia and hematemesis.  Genitourinary: Positive for vaginal bleeding (menses). Negative for dysuria, urgency, frequency, hematuria, flank pain, vaginal discharge and difficulty urinating.  Musculoskeletal: Negative for neck pain and neck stiffness.  Skin: Negative for rash.  Neurological: Negative for weakness, numbness and headaches.  All other systems reviewed and are negative.      Allergies  Shellfish allergy and Iodine  Home Medications   Current Outpatient Rx  Name  Route  Sig  Dispense  Refill  . acetaminophen (TYLENOL) 500 MG tablet   Oral   Take 1,000 mg by mouth every 6 (six) hours as needed for mild pain.         Marland Kitchen albuterol (PROVENTIL HFA;VENTOLIN HFA) 108 (90 BASE) MCG/ACT inhaler   Inhalation  Inhale 2 puffs into the lungs every 4 (four) hours as needed for wheezing or shortness of breath.          BP 123/93  Pulse 71  Temp(Src) 98 F (36.7 C) (Oral)  Resp 18  Ht 5\' 5"  (1.651 m)  Wt 278 lb (126.1 kg)  BMI 46.26 kg/m2  SpO2 100%  LMP 06/08/2013 Physical Exam  Nursing note and vitals reviewed. Constitutional: She is oriented to person, place, and time. She appears well-developed and well-nourished. No distress.  HENT:  Head:  Normocephalic and atraumatic.  Mouth/Throat: Oropharynx is clear and moist.  Eyes: Conjunctivae and EOM are normal. Pupils are equal, round, and reactive to light.  Neck: Normal range of motion. Neck supple.  Cardiovascular: Normal rate, regular rhythm and normal heart sounds.  Exam reveals no gallop and no friction rub.   No murmur heard. Pulmonary/Chest: Effort normal and breath sounds normal. No respiratory distress. She has no wheezes. She has no rales.  Abdominal: Soft. Normal appearance and bowel sounds are normal. She exhibits no distension and no mass. There is no hepatosplenomegaly. There is no tenderness. There is no rigidity, no rebound, no guarding, no CVA tenderness, no tenderness at McBurney's point and negative Murphy's sign. No hernia. Hernia confirmed negative in the right inguinal area and confirmed negative in the left inguinal area.  Genitourinary: Vagina normal. There is no rash, tenderness or lesion on the right labia. There is no rash, tenderness or lesion on the left labia. Cervix exhibits no motion tenderness, no discharge and no friability. Right adnexum displays no mass, no tenderness and no fullness. Left adnexum displays no mass, no tenderness and no fullness. No erythema or bleeding around the vagina. No foreign body around the vagina. No signs of injury around the vagina. No vaginal discharge found.  Musculoskeletal: She exhibits no tenderness.  Neurological: She is alert and oriented to person, place, and time. No cranial nerve deficit. She exhibits normal muscle tone. Coordination normal.  Skin: Skin is warm and dry. She is not diaphoretic.    ED Course  Procedures (including critical care time) Labs Review Labs Reviewed  I-STAT CHEM 8, ED - Abnormal; Notable for the following:    Glucose, Bld 102 (*)    All other components within normal limits   Imaging Review US Transvaginal Non-ob  06/07/2013   CLINICAL DATA:  Right-sided pelvic pain.  LMP 05/31/2013.   EXAM: TRANSABDOMINAL AND TRANSVAGINAL ULTRASOUND OF PELVIS  DOPPLER ULTRASOUND OF OVARIES  TECHNIQUE: Both transabdominal and transvaginal ultrasound examinations of the pelvis were performed. Transabdominal technique was performed for global imaging of the pelvis including uterus, ovaries, adnexal regions, and pelvic cul-de-sac.  It was necessary to proceed with endovaginal exam following the transabdominal exam to visualize the endometrium and ovaries. Color and duplex Doppler ultrasound was utilized to evaluate blood flow to the ovaries.  COMPARISON:  None.  FINDINGS: Uterus  Measurements: 7.3 x 4.0 x 4.6 cm. No fibroids or other mass visualized.  Endometrium  Thickness: 6 mm.  No focal abnormality visualized.  Right ovary  Measurements: 5.0 x 2.4 x 3.0 cm. Normal appearance/no adnexal mass.  Left ovary  Measurements: 4.0 x 2.3 x 2.2 cm. Normal appearance/no adnexal mass.  Pulsed Doppler evaluation of both ovaries demonstrates normal low-resistance arterial and venous waveforms.  Other findings  No free fluid.  IMPRESSION: Normal appearance of uterus and both ovaries. No pelvic mass or other significant abnormality identified.  No sonographic evidence for ovarian torsion.  Electronically Signed   By: Myles Rosenthal M.D.   On: 06/07/2013 14:34   US Pelvis Complete  06/07/2013   CLINICAL DATA:  Right-sided pelvic pain.  LMP 05/31/2013.  EXAM: TRANSABDOMINAL AND TRANSVAGINAL ULTRASOUND OF PELVIS  DOPPLER ULTRASOUND OF OVARIES  TECHNIQUE: Both transabdominal and transvaginal ultrasound examinations of the pelvis were performed. Transabdominal technique was performed for global imaging of the pelvis including uterus, ovaries, adnexal regions, and pelvic cul-de-sac.  It was necessary to proceed with endovaginal exam following the transabdominal exam to visualize the endometrium and ovaries. Color and duplex Doppler ultrasound was utilized to evaluate blood flow to the ovaries.  COMPARISON:  None.  FINDINGS: Uterus   Measurements: 7.3 x 4.0 x 4.6 cm. No fibroids or other mass visualized.  Endometrium  Thickness: 6 mm.  No focal abnormality visualized.  Right ovary  Measurements: 5.0 x 2.4 x 3.0 cm. Normal appearance/no adnexal mass.  Left ovary  Measurements: 4.0 x 2.3 x 2.2 cm. Normal appearance/no adnexal mass.  Pulsed Doppler evaluation of both ovaries demonstrates normal low-resistance arterial and venous waveforms.  Other findings  No free fluid.  IMPRESSION: Normal appearance of uterus and both ovaries. No pelvic mass or other significant abnormality identified.  No sonographic evidence for ovarian torsion.   Electronically Signed   By: Myles Rosenthal M.D.   On: 06/07/2013 14:34   Ct Abdomen Pelvis W Contrast  06/08/2013   CLINICAL DATA:  Lower right-sided abdominal and pelvic pain intermittently for the past year.  EXAM: CT ABDOMEN AND PELVIS WITH CONTRAST  TECHNIQUE: Multidetector CT imaging of the abdomen and pelvis was performed using the standard protocol following bolus administration of intravenous contrast.  CONTRAST:  OMNIPAQUE IOHEXOL 300 MG/ML  SOLN  COMPARISON:  No priors.  FINDINGS: Lung Bases: Unremarkable.  Abdomen/Pelvis: Diffuse low attenuation throughout the hepatic parenchyma suggestive of hepatic steatosis (difficult to say for certain on today's contrast enhanced CT examination), likely with some focal fatty sparing in the region of segment 1. The appearance of the gallbladder, pancreas, spleen, bilateral adrenal glands and bilateral kidneys is unremarkable. Normal appendix. No significant volume of ascites. No pneumoperitoneum. No pathologic distention of small bowel. No lymphadenopathy identified within the abdomen or pelvis. Uterus and ovaries are unremarkable in appearance.  Musculoskeletal: There are no aggressive appearing lytic or blastic lesions noted in the visualized portions of the skeleton.  IMPRESSION: 1. No acute findings in the abdomen or pelvis to account for the patient's  symptoms. 2. Normal appendix. 3. The appearance of the liver suggests hepatic steatosis.   Electronically Signed   By: Trudie Reed M.D.   On: 06/08/2013 10:37     EKG Interpretation None      MDM    DEJUANA WEIST is a 33 y.o. female who presents complaining of chronic RLQ pain for one year.  No associated fever, chills, nausea, vomiting, diarrhea, constipation, vaginal bleeding.  Is currently on her menstrual period.  Her vitals are normal.  Her exam is reassuring.  abd soft, ND, NT, no rebound or guarding.  Negative Murphy sign.  No tenderness at McBurney point.  Normal pelvic exam with no adnexal mass or tenderness, no CMT.  Review of records shows she was here yesterday for the same, had negative pregnancy test, normal urine studies, normal CBC and CMP, and normal pelvic ultrasound.  9:24 AM: reevaluated; resting comfortably; exam unchanged.  texting on cell phone. 10:26 AM: continues to be comfortable, pain free, exam unchanged.  Back from  CT. Labs reviewed; no leukocytosis.  Normal creatinine and electrolytes.   Reevaluated again prior to discharge; exam unchanged.  CT negative for appendicitis or other acute abnormality.  I do not feel she is likely to have ovarian torsion, kidney stone, or other emergent process.  She recently had normal pelvic ultrasound.  She is discharged home with return precautions.  Gave resource printout with community resources to help her establish primary care, and I advised f/u with her former PCP and gynecologist in [place], [place] for reevaluation in the interim.  I feel this is likely dysmenorrhea/endometriosis or other benign pelvic etiology.  The encounter diagnosis was Abdominal pain.   Toney SangJerrid Jasia Hiltunen, MD 06/08/13 [phone]

## 2013-06-08 NOTE — Discharge Instructions (Signed)
°Emergency Department Resource Guide °1) Find a Doctor and Pay Out of Pocket °Although you won't have to find out who is covered by your insurance plan, it is a good idea to ask around and get recommendations. You will then need to call the office and see if the doctor you have chosen will accept you as a new patient and what types of options they offer for patients who are self-pay. Some doctors offer discounts or will set up payment plans for their patients who do not have insurance, but you will need to ask so you aren't surprised when you get to your appointment. ° °2) Contact Your Local Health Department °Not all health departments have doctors that can see patients for sick visits, but many do, so it is worth a call to see if yours does. If you don't know where your local health department is, you can check in your phone book. The CDC also has a tool to help you locate your state's health department, and many state websites also have listings of all of their local health departments. ° °3) Find a Walk-in Clinic °If your illness is not likely to be very severe or complicated, you may want to try a walk in clinic. These are popping up all over the country in pharmacies, drugstores, and shopping centers. They're usually staffed by nurse practitioners or physician assistants that have been trained to treat common illnesses and complaints. They're usually fairly quick and inexpensive. However, if you have serious medical issues or chronic medical problems, these are probably not your best option. ° °No Primary Care Doctor: °- Call [facility] at  832-8000 - they can help you locate a primary care doctor that  accepts your insurance, provides certain services, etc. °- Physician Referral Service- 1-800-533-3463 ° °Chronic Pain Problems: °Organization         Address  Phone   Notes  °[facility]  (336) 297-2271 Patients need to be referred by their primary care doctor.  ° °Medication  Assistance: °Organization         Address  Phone   Notes  °[place] [facility] [address] °[place], [place] [address] (336) 641-8030 --Must be a resident of [place] °-- Must have NO insurance coverage whatsoever (no Medicaid/ Medicare, etc.) °-- The pt. MUST have a primary care doctor that directs their care regularly and follows them in the community °  °[facility]  (866) 331-1348   °[employer]  (888) 892-1162   ° °Agencies that provide inexpensive medical care: °Organization         Address  Phone   Notes  °[facility]  (336) 832-8035   °[facility]    (336) 832-7272   °[facility] [address] °[place], [place] [address] (336) 832-4777   °[facility] [address], °[place] (336) 271-4999   °[facility]    (336) 373-0678   °[facility]    (336) 272-1050   °[facility] ° [address], [place] Phone:  (336) 832-4444, Fax:  (336) 832-4440 Hours of Operation:  9 am - 6 pm, M-F.  Also accepts Medicaid/Medicare and self-pay.  °[facility] ° [address], [place] Phone: (336) 832-3150, Fax: (336) 832-3151. Hours of Operation:  8:30 am - 5:30 pm, M-F.  Also accepts Medicaid and self-pay.  °[facility] [place] [address], [place] Phone: (336) 878-6027   °[facility] [address], [place], [place] (336)723-1848, Ext. 123 Mondays & Thursdays: 7-9 AM.  First 15 patients are seen on a first come, first serve basis. °  ° °Medicaid-accepting [place] Providers: ° °Organization         Address  Phone   Notes  °[facility] [address], [place] (336) 641-2100 Also accepts self-pay patients.  °[facility] [address], [place] ° (336) 856-9996   °[facility] [address], [place]  (336) 288-8857   °[facility] [address] [place] [address], [place] (336) 299-7000   °Veita Bland [address], [place]  ° (336) 373-1557 Only accepts [place] Access Medicaid patients after they have their name applied to their card.  ° °Self-Pay (no insurance) in [place]: ° °Organization         Address  Phone   Notes  °Sickle Cell Patients, [facility] [address], [place] (336) 832-1970   °[facility] [address], [place] (336) 832-4400   °[facility] ° [address], [place] (336) 992-4800   °[facility]/Dr. Osei-Bonsu ° [address] [place] [address], [place] or [address], [place] (336) 841-8500 Phone number for both [place] and [place] locations is the same.  °[facility] [address], [place] (336) 299-0000   °[facility] [address] [place] [address], [place] or [address] (336) 852-7530 °(336) 878-2260   °[facility] [address], [place] (336) 350-1642, phone; (336) 294-5005, fax Sees patients 1st and 3rd Saturday of every month.  Must not qualify for public or private insurance (i.e. Medicaid, Medicare, [place] Health Choice, Veterans' Benefits) • Household income should be no more than 200% of the poverty level •The clinic cannot treat you if you are pregnant or think you are pregnant • Sexually transmitted diseases are not treated at the clinic.  ° ° °Dental Care: °Organization         Address  Phone  Notes  °[place] [facility] [address], [place] (336) 641-6152 Accepts children up to age 21 who are enrolled in Medicaid or [place] Health Choice; pregnant women with a Medicaid card; and children who have applied for Medicaid or [place] Health Choice, but were declined, whose parents can pay a reduced fee at time of service.  °[place]  [facility] [place]  [address], [place] (336) 641-7733 Accepts children up to age 21 who are enrolled in Medicaid or [place] Health Choice; pregnant women with a Medicaid card; and children who have applied for Medicaid or [place] Health Choice, but were declined, whose parents can pay a reduced fee at time of service.  °[facility] ° [address],  (336) 641-4533 Patients are seen by appointment only. Walk-ins are not accepted. [facility] will see patients 18 years of age and older. °Monday - Tuesday (8am-5pm) °Most Wednesdays (8:30-5pm) °$30 per visit, cash only  °[facility] ° [address], [place] (336) 641-4533 Patients are seen by appointment only. Walk-ins are not accepted. [facility] will see patients 18 years of age and older. °One   Wednesday Evening (Monthly: Volunteer Based).  $30 per visit, cash only  °[facility]  (919) 537-3737 for adults; Children under age 4, call Graduate Pediatric Dentistry at ([phone]) [phone]. Children aged 4-14, please call (919) [phone] to request a pediatric application. ° Dental services are provided in all areas of dental care including fillings, crowns and bridges, complete and partial dentures, implants, gum treatment, root canals, and extractions. Preventive care is also provided. Treatment is provided to both adults and children. °Patients are selected via a lottery and there is often a waiting list. °  °[facility] [address], °[place] ° (336) 763-8833 [email or web address] °  °[facility] [address], [place], [place] (336)723-1848, Ext. 123 Second and Fourth Thursday of each month, opens at 6:30 AM; Clinic ends at 9 AM.  Patients are seen on a first-come first-served basis, and a limited number are seen during each clinic.  ° °[facility] ° [address], [place], [place] (336) 723-7904    Eligibility Requirements °You must have lived in [place], or [place] counties for at least the last three months. °  You cannot be eligible for state or federal sponsored healthcare insurance, including Veterans Administration, Medicaid, or Medicare. °  You generally cannot be eligible for healthcare insurance through your employer.  °  How to apply: °Eligibility screenings are held every Tuesday and Wednesday afternoon from 1:00 pm until 4:00 pm. You do not need an appointment for the interview!  °[facility] [address], [place], [place] 336-631-2330   °[facility]  336-342-8273   °[facility]  336-703-3100   °[facility]  336-570-6415   ° °Behavioral Health Resources in the Community: °Intensive Outpatient Programs °Organization         Address  Phone  Notes  °[facility] [address], [place], [place] 336-878-6098   °[facility] [address], [place], [place] 336-832-9800   °[facility] [address], [place], [place] ° 336-882-2125   °[facility] [address],  °[place], [place] 1-800-853-5163 or 336-641-4981   °Substance Abuse Resources °Organization         Address  Phone  Notes  °[facility]  336-882-2125   °[facility]  336-784-9470   °[facility]  336-285-9073   °[facility]  336-845-3988   °Residential & Outpatient Substance Abuse Program  1-800-659-3381   °Psychological Services °Organization         Address  Phone  Notes  °[facility]  336- 832-9600   °[facility]  336- 378-7881   °[facility] [address], [place] 1-800-853-5163 or 336-641-4981   ° °Mobile Crisis Teams °Organization         Address  Phone  Notes  °[facility], Mobile Crisis Care Unit  1-877-626-1772   °Assertive °[facility] ° [address]  [place], [place] 336-834-9664   °Sharon DeEsch [address] °[place] 336-554-5454   ° °Self-Help/Support Groups °Organization         Address  Phone             Notes  °[facility] - variety of support groups  336- 373-1402 Call for more information  °Narcotics Anonymous (NA), [facility] [address], °[place]  2 meetings at this location  ° °  Residential Treatment Programs °Organization         Address  Phone  Notes  °[facility] [address],    °[place]  1-866-801-8205   °[facility] ° [address], [place], [place] 704-293-8524   °[facility] [address], [place] 336-845-3988 Admissions: 8am-3pm M-F  °[facility] [address]    °[place], [place] 336-841-1104   °[facility] [address], [place], [place] 336-379-7146   °[facility] [address]  °[place], [place] 336-285-9073   °[facility] - Intensive Outpatient [address], [place], [place] 336-852-3033   °[facility] [address]  °[place], [place] 1-877-615-2722 or 336-784-9470   °[facility] [address] [place], [place] 336-227-7417 Accepts Medicaid  °[facility] [address]  °[place] 1-800-659-3381 Substance Abuse/Addiction Treatment  ° °[place] Behavioral Health Resources °Organization         Address  Phone  Notes  °[facility]  (888) 581-9988   °Julie Brannon, PhD [address] [place], [place]   (336) 349-5553 or (336) 951-0000   °[facility]   [address] °[place], [place] (336) 349-4454   °[facility] [address], [place], [place] (336) 342-8316 Insurance/Medicaid/sponsorship through [facility]  °[facility] [address]                                    [place], [place] (336) 342-8316 Therapy/tele-psych/case    °[facility] [address]  ° [place], [place] (336) 349-2233    °Dr. Arfeen  (336) 349-4544   °[facility] [place]  [employer] [facility] [place] [facility] 1) [address], [place] °2) [address], [place] °3)  [address], [place] (336) 349-3220 °(336) 342-7768 ° °(336) 342-8140   °[place] [facility] (336) 342-1394 or (336) 342-3537 (After Hours)    ° °  °Abdominal Pain, Adult °Many things can cause abdominal pain. Usually, abdominal pain is not caused by a disease and will improve without treatment. It can often be observed and treated at home. Your health care provider will do a physical exam and possibly order blood tests and X-rays to help determine the seriousness of your pain. However, in many cases, more time must pass before a clear cause of the pain can be found. Before that point, your health care provider may not know if you need more testing or further treatment. °HOME CARE INSTRUCTIONS  °Monitor your abdominal pain for any changes. The following actions may help to alleviate any discomfort you are experiencing: °· Only take over-the-counter or prescription medicines as directed by your health care provider. °· Do not take laxatives unless directed to do so by your health care provider. °· Try a clear liquid diet (broth, tea, or water) as directed by your health care provider. Slowly move to a bland diet as tolerated. °SEEK MEDICAL CARE IF: °· You have unexplained abdominal pain. °· You have abdominal pain associated with nausea or diarrhea. °· You have pain when you urinate or have a bowel movement. °· You experience abdominal pain that wakes you in the night. °· You have abdominal pain that is worsened or improved by eating food. °· You have abdominal pain that   is worsened with eating fatty foods. °SEEK IMMEDIATE MEDICAL CARE IF:  °· Your pain does not go away within 2 hours. °· You have a fever. °· You keep throwing up (vomiting). °· Your pain is  felt only in portions of the abdomen, such as the right side or the left lower portion of the abdomen. °· You pass bloody or black tarry stools. °MAKE SURE YOU: °· Understand these instructions.   °· Will watch your condition.   °· Will get help right away if you are not doing well or get worse.   °Document Released: 12/20/2004 Document Revised: 12/31/2012 Document Reviewed: 11/19/2012 °ExitCare® Patient Information ©2014 [employer]. ° ° °  °

## 2013-06-08 NOTE — ED Notes (Addendum)
Pt here from home. Pt states she has lower right abd pain.  Pt states she has had these pains on and off for about a year. Pt states pain started again about 3 days ago.  Pt was in the ER for the same problem yesterday. Pt has a hx of ovarian cysts.

## 2013-06-09 NOTE — ED Provider Notes (Signed)
I saw and evaluated the patient, reviewed the resident's note and I agree with the findings and plan.   EKG Interpretation None      Pt c/o lower abd pain intermittently x months. abd soft nt on recheck.   Suzi RootsKevin E Quiera Diffee, MD 06/09/13 [phone]

## 2013-12-20 ENCOUNTER — Encounter (HOSPITAL_COMMUNITY): Payer: Self-pay | Admitting: Emergency Medicine

## 2013-12-20 ENCOUNTER — Emergency Department (HOSPITAL_COMMUNITY)
Admission: EM | Admit: 2013-12-20 | Discharge: 2013-12-21 | Disposition: A | Discharge status: Home / Self Care | Payer: No Typology Code available for payment source | Attending: Emergency Medicine | Admitting: Emergency Medicine

## 2013-12-20 DIAGNOSIS — Z3202 Encounter for pregnancy test, result negative: Secondary | ICD-10-CM | POA: Insufficient documentation

## 2013-12-20 DIAGNOSIS — N939 Abnormal uterine and vaginal bleeding, unspecified: Secondary | ICD-10-CM

## 2013-12-20 DIAGNOSIS — J45909 Unspecified asthma, uncomplicated: Secondary | ICD-10-CM | POA: Insufficient documentation

## 2013-12-20 DIAGNOSIS — N898 Other specified noninflammatory disorders of vagina: Secondary | ICD-10-CM | POA: Insufficient documentation

## 2013-12-20 DIAGNOSIS — Z87891 Personal history of nicotine dependence: Secondary | ICD-10-CM | POA: Insufficient documentation

## 2013-12-20 DIAGNOSIS — Z79899 Other long term (current) drug therapy: Secondary | ICD-10-CM | POA: Insufficient documentation

## 2013-12-20 HISTORY — DX: Unspecified ectopic pregnancy without intrauterine pregnancy: O00.90

## 2013-12-20 LAB — URINALYSIS, ROUTINE W REFLEX MICROSCOPIC
BILIRUBIN URINE: NEGATIVE
Glucose, UA: NEGATIVE mg/dL
Ketones, ur: NEGATIVE mg/dL
Leukocytes, UA: NEGATIVE
NITRITE: NEGATIVE
PH: 6 (ref 5.0–8.0)
Protein, ur: NEGATIVE mg/dL
SPECIFIC GRAVITY, URINE: 1.028 (ref 1.005–1.030)
Urobilinogen, UA: 0.2 mg/dL (ref 0.0–1.0)

## 2013-12-20 LAB — WET PREP, GENITAL
Clue Cells Wet Prep HPF POC: NONE SEEN
Trich, Wet Prep: NONE SEEN
Yeast Wet Prep HPF POC: NONE SEEN

## 2013-12-20 LAB — URINE MICROSCOPIC-ADD ON

## 2013-12-20 LAB — HCG, QUANTITATIVE, PREGNANCY: hCG, Beta Chain, Quant, S: <1 m[IU]/mL (ref <5)

## 2013-12-20 NOTE — ED Provider Notes (Signed)
CSN: [id number]     Arrival date & time 12/20/13  1935 History   First MD Initiated Contact with Patient 12/20/13 1957     Chief Complaint  Patient presents with  . Vaginal Bleeding     (Consider location/radiation/quality/duration/timing/severity/associated sxs/prior Treatment) HPI  GALAXY BORDEN is a(n) 33 y.o. female who presents to the ED with cc of vaginal bleeding. Patient states that she had a sensation of vaginal wetness on Wednesday and when she went to the bathroom she passed a large clot. She hash had crampy abdominal pain since that time with vaginal spotting. She showed a picture of the clot to her cousin who is a Engineer, civil (consulting). The cousin told her that it looked like a baby and she needed medical evaluation. She s concerned because she has a history of misscariage and ectopic pregnancy. She has been having regular periods and her lmp was 9/1//2015. She denies any other urinary or vaginal sxs.  Denies fevers, chills, myalgias, arthralgias. Denies DOE, SOB, chest tightness or pressure, radiation to left arm, jaw or back, or diaphoresis. Denies dysuria, flank pain, suprapubic pain, frequency, urgency, or hematuria. Denies headaches, light headedness, weakness, visual disturbances. Denies abdominal pain, nausea, vomiting, diarrhea or constipation.   Past Medical History  Diagnosis Date  . Asthma   . Ectopic pregnancy    Past Surgical History  Procedure Laterality Date  . Salpingectomy     No family history on file. History  Substance Use Topics  . Smoking status: Former Games developer  . Smokeless tobacco: Never Used  . Alcohol Use: No     Comment: occ   OB History   Grav Para Term Preterm Abortions TAB SAB Ect Mult Living                 Review of Systems  Ten systems reviewed and are negative for acute change, except as noted in the HPI.    Allergies  Shellfish allergy and Iodine  Home Medications   Prior to Admission medications   Medication Sig Start Date End Date  Taking? Authorizing Provider  acetaminophen (TYLENOL) 500 MG tablet Take 1,000 mg by mouth every 6 (six) hours as needed for mild pain.   Yes Historical Provider, MD  albuterol (PROVENTIL HFA;VENTOLIN HFA) 108 (90 BASE) MCG/ACT inhaler Inhale 2 puffs into the lungs every 4 (four) hours as needed for wheezing or shortness of breath.   Yes Historical Provider, MD   BP 129/70  Pulse 84  Temp(Src) 98.1 F (36.7 C) (Oral)  Resp 16  Ht  (1.651 m)  Wt 290 lb (131.543 kg)  BMI 48.26 kg/m2  SpO2 98%  LMP 11/19/2013 Physical Exam  Nursing note and vitals reviewed. Constitutional: She is oriented to person, place, and time. She appears well-developed and well-nourished. No distress.  HENT:  Head: Normocephalic and atraumatic.  Eyes: Conjunctivae are normal. No scleral icterus.  Neck: Normal range of motion.  Cardiovascular: Normal rate, regular rhythm and normal heart sounds.  Exam reveals no gallop and no friction rub.   No murmur heard. Pulmonary/Chest: Effort normal and breath sounds normal. No respiratory distress.  Abdominal: Soft. Bowel sounds are normal. She exhibits no distension and no mass. There is no tenderness. There is no guarding.  Genitourinary:  Exam performed by Arthor Captain,  exam chaperoned Date: 12/20/2013 Pelvic exam: normal external genitalia without evidence of trauma. VULVA: normal appearing vulva with no masses, tenderness or lesion. VAGINA: normal appearing vagina with normal color and discharge, no lesions.  CERVIX: normal appearing cervix without lesions, cervical motion tenderness absent, cervical os closed with out purulent discharge; vaginal discharge - mucoid and blood tinged, Wet prep and DNA probe for chlamydia and GC obtained.   ADNEXA: normal adnexa in size, nontender and no masses UTERUS: uterus is normal size, shape, consistency and nontender.    Neurological: She is alert and oriented to person, place, and time.  Skin: Skin is warm and dry. She  is not diaphoretic.    ED Course  Procedures (including critical care time) Labs Review Labs Reviewed  WET PREP, GENITAL - Abnormal; Notable for the following:    WBC, Wet Prep HPF POC MODERATE (*)    All other components within normal limits  GC/CHLAMYDIA PROBE AMP  HCG, QUANTITATIVE, PREGNANCY  URINALYSIS, ROUTINE W REFLEX MICROSCOPIC  RPR  HIV ANTIBODY (ROUTINE TESTING)  POC URINE PREG, ED    Imaging Review No results found.   EKG Interpretation None      MDM   Final diagnoses:  Vaginal bleeding    Patient with negative work up . Negative quant HCG which should still be elevated at this point if she miscarried. No other abnotmalities. D/c home  Fu with gyn     Arthor Captain, PA-C 12/21/13 1247

## 2013-12-20 NOTE — Discharge Instructions (Signed)
You were negative for pregnancy hormones.  You appear to be having some abnormal bleeding. Please follow up with your ob gyn.  Abnormal Uterine Bleeding Abnormal uterine bleeding can affect women at various stages in life, including teenagers, women in their reproductive years, pregnant women, and women who have reached menopause. Several kinds of uterine bleeding are considered abnormal, including:  Bleeding or spotting between periods.   Bleeding after sexual intercourse.   Bleeding that is heavier or more than normal.   Periods that last longer than usual.  Bleeding after menopause.  Many cases of abnormal uterine bleeding are minor and simple to treat, while others are more serious. Any type of abnormal bleeding should be evaluated by your health care provider. Treatment will depend on the cause of the bleeding. HOME CARE INSTRUCTIONS Monitor your condition for any changes. The following actions may help to alleviate any discomfort you are experiencing:  Avoid the use of tampons and douches as directed by your health care provider.  Change your pads frequently. You should get regular pelvic exams and Pap tests. Keep all follow-up appointments for diagnostic tests as directed by your health care provider.  SEEK MEDICAL CARE IF:   Your bleeding lasts more than 1 week.   You feel dizzy at times.  SEEK IMMEDIATE MEDICAL CARE IF:   You pass out.   You are changing pads every 15 to 30 minutes.   You have abdominal pain.  You have a fever.   You become sweaty or weak.   You are passing large blood clots from the vagina.   You start to feel nauseous and vomit. MAKE SURE YOU:   Understand these instructions.  Will watch your condition.  Will get help right away if you are not doing well or get worse. Document Released: 03/12/2005 Document Revised: 03/17/2013 Document Reviewed: 10/09/2012 [facility] Patient Information 2015 [place], [place]. This information is  not intended to replace advice given to you by your health care provider. Make sure you discuss any questions you have with your health care provider.

## 2013-12-20 NOTE — ED Notes (Signed)
Patient reports passing bloody clot/tissue on Thursday. Patient spoke to a family member that is a Engineer, civil (consulting) and advised patient come to the ER as it looked like a "baby". Patient states she has the tissue with her and also had photographs. Patient states she is now having sharp pelvic pains, and nauseated, states this has been ongoing x2 weeks. Patient states she is only having vaginal spotting at this time, denies any significant bleeding.

## 2013-12-21 LAB — GC/CHLAMYDIA PROBE AMP
CT PROBE, AMP APTIMA: NEGATIVE
GC PROBE AMP APTIMA: NEGATIVE

## 2013-12-21 LAB — POC URINE PREG, ED: Preg Test, Ur: NEGATIVE

## 2013-12-21 LAB — RPR

## 2013-12-21 LAB — HIV ANTIBODY (ROUTINE TESTING W REFLEX): HIV 1&2 Ab, 4th Generation: NONREACTIVE

## 2013-12-21 NOTE — ED Provider Notes (Signed)
Medical screening examination/treatment/procedure(s) were performed by non-physician practitioner and as supervising physician I was immediately available for consultation/collaboration.   EKG Interpretation None        Sehaj Mcenroe, MD 12/21/13 2345 

## 2014-02-06 ENCOUNTER — Emergency Department (HOSPITAL_COMMUNITY)
Admission: EM | Admit: 2014-02-06 | Discharge: 2014-02-07 | Disposition: A | Discharge status: Home / Self Care | Payer: Self-pay | Attending: Emergency Medicine | Admitting: Emergency Medicine

## 2014-02-06 ENCOUNTER — Emergency Department (HOSPITAL_COMMUNITY): Payer: Self-pay

## 2014-02-06 ENCOUNTER — Encounter (HOSPITAL_COMMUNITY): Payer: Self-pay | Admitting: Emergency Medicine

## 2014-02-06 DIAGNOSIS — Z79899 Other long term (current) drug therapy: Secondary | ICD-10-CM | POA: Insufficient documentation

## 2014-02-06 DIAGNOSIS — Y92009 Unspecified place in unspecified non-institutional (private) residence as the place of occurrence of the external cause: Secondary | ICD-10-CM | POA: Insufficient documentation

## 2014-02-06 DIAGNOSIS — Z87891 Personal history of nicotine dependence: Secondary | ICD-10-CM | POA: Insufficient documentation

## 2014-02-06 DIAGNOSIS — Y998 Other external cause status: Secondary | ICD-10-CM | POA: Insufficient documentation

## 2014-02-06 DIAGNOSIS — Z8759 Personal history of other complications of pregnancy, childbirth and the puerperium: Secondary | ICD-10-CM | POA: Insufficient documentation

## 2014-02-06 DIAGNOSIS — M25561 Pain in right knee: Secondary | ICD-10-CM

## 2014-02-06 DIAGNOSIS — S8992XA Unspecified injury of left lower leg, initial encounter: Secondary | ICD-10-CM | POA: Insufficient documentation

## 2014-02-06 DIAGNOSIS — J45909 Unspecified asthma, uncomplicated: Secondary | ICD-10-CM | POA: Insufficient documentation

## 2014-02-06 DIAGNOSIS — X58XXXA Exposure to other specified factors, initial encounter: Secondary | ICD-10-CM | POA: Insufficient documentation

## 2014-02-06 DIAGNOSIS — E669 Obesity, unspecified: Secondary | ICD-10-CM | POA: Insufficient documentation

## 2014-02-06 DIAGNOSIS — Y9301 Activity, walking, marching and hiking: Secondary | ICD-10-CM | POA: Insufficient documentation

## 2014-02-06 NOTE — ED Notes (Signed)
Pt arrived to the ED with a complaint of right knee pain.  Pt states that she missed a step at her apartment complex and fell hitting her knee.  Pt is ambulatory but is favoring her left side.

## 2014-02-07 MED ORDER — IBUPROFEN 800 MG PO TABS: 800.0000 mg | ORAL_TABLET | Freq: Three times a day (TID) | ORAL | Status: DC

## 2014-02-07 MED ORDER — IBUPROFEN 800 MG PO TABS
800.0000 mg | ORAL_TABLET | Freq: Once | ORAL | Status: AC
  Administered 2014-02-07: 800 mg via ORAL
  Filled 2014-02-07: qty 1

## 2014-02-07 NOTE — ED Notes (Signed)
MD at bedside. 

## 2014-02-07 NOTE — Discharge Instructions (Signed)
Arthralgia Susan Faulkner, you were seen today for knee pain.  Your x-rays were negative for any fractures. He can use ice packs, Motrin, and stretching at home for pain relief. Follow-up with a primary care physician within 3 days for continued treatment. If any symptoms worsen come back to emergency department for repeat evaluation. Thank you. Arthralgia is joint pain. A joint is a place where two bones meet. Joint pain can happen for many reasons. The joint can be bruised, stiff, infected, or weak from aging. Pain usually goes away after resting and taking medicine for soreness.  HOME CARE  Rest the joint as told by your doctor.  Keep the sore joint raised (elevated) for the first 24 hours.  Put ice on the joint area.  Put ice in a plastic bag.  Place a towel between your skin and the bag.  Leave the ice on for 15-20 minutes, 03-04 times a day.  Wear your splint, casting, elastic bandage, or sling as told by your doctor.  Only take medicine as told by your doctor. Do not take aspirin.  Use crutches as told by your doctor. Do not put weight on the joint until told to by your doctor. GET HELP RIGHT AWAY IF:   You have bruising, puffiness (swelling), or more pain.  Your fingers or toes turn blue or start to lose feeling (numb).  Your medicine does not lessen the pain.  Your pain becomes severe.  You have a temperature by mouth above 102 F (38.9 C), not controlled by medicine.  You cannot move or use the joint. MAKE SURE YOU:   Understand these instructions.  Will watch your condition.  Will get help right away if you are not doing well or get worse. Document Released: 02/28/2009 Document Revised: 06/04/2011 Document Reviewed: 02/28/2009 [facility] Patient Information 2015 [place], [place]. This information is not intended to replace advice given to you by your health care provider. Make sure you discuss any questions you have with your health care provider.  Emergency  Department Resource Guide 1) Find a Doctor and Pay Out of Pocket Although you won't have to find out who is covered by your insurance plan, it is a good idea to ask around and get recommendations. You will then need to call the office and see if the doctor you have chosen will accept you as a new patient and what types of options they offer for patients who are self-pay. Some doctors offer discounts or will set up payment plans for their patients who do not have insurance, but you will need to ask so you aren't surprised when you get to your appointment.  2) Contact Your Local Health Department Not all health departments have doctors that can see patients for sick visits, but many do, so it is worth a call to see if yours does. If you don't know where your local health department is, you can check in your phone book. The CDC also has a tool to help you locate your state's health department, and many state websites also have listings of all of their local health departments.  3) Find a Walk-in Clinic If your illness is not likely to be very severe or complicated, you may want to try a walk in clinic. These are popping up all over the country in pharmacies, drugstores, and shopping centers. They're usually staffed by nurse practitioners or physician assistants that have been trained to treat common illnesses and complaints. They're usually fairly quick and inexpensive. However, if  you have serious medical issues or chronic medical problems, these are probably not your best option.  No Primary Care Doctor: - Call Health Connect at  [phone] - they can help you locate a primary care doctor that  accepts your insurance, provides certain services, etc. - Physician Referral Service- [phone]  Chronic Pain Problems: Organization         Address  Phone   Notes  [facility]  [phone] Patients need to be referred by their primary care doctor.   Medication  Assistance: Organization         Address  Phone   Notes  [facility] Medication [facility] [address] [place], [place] [address] [phone]) [phone] --Must be a resident of [facility] [place] -- Must have NO insurance coverage whatsoever (no Medicaid/ Medicare, etc.) -- The pt. MUST have a primary care doctor that directs their care regularly and follows them in the community   MedAssist  [phone]) [phone]   [employer]  [phone]    Agencies that provide inexpensive medical care: Organization         Address  Phone   Notes  [facility]  [phone]   [facility]    [phone]   [facility] [address] [place], [place] [address] [phone]   [facility] [address], [place] [phone]) [phone]   Planned Parenthood    [phone]   [facility]    [phone]   [facility]  [address], [place] Phone:  [phone]) [phone], Fax:  [phone] Hours of Operation:  9 am - 6 pm, M-F.  Also accepts Medicaid/Medicare and self-pay.  [facility]  [address], [place] Phone: [phone], Fax: [phone]) [phone]. Hours of Operation:  8:30 am - 5:30 pm, M-F.  Also accepts Medicaid and self-pay.  [facility] [place] [address], [place] Phone: [phone]) [phone]   [facility] [address], [place], [place] [phone], Ext. [phone] Mondays & Thursdays: 7-9 AM.  First 15 patients are seen on a first come, first serve basis.    Medicaid-accepting [facility] Providers:  Organization         Address  Phone   Notes  [facility] [address], [place] [phone]) [phone] Also accepts self-pay patients.  [facility] [address], [place]  [phone]) [phone]   [facility] [address], [place]  [phone](336) [phone]   [facility] [address] [place] [address], [place] [phone]) [phone]   Renaye RakersVeita Bland [address], [place]   [phone] Only accepts [place] Access [place]icaid patients after they have their name applied to their card.   Self-Pay (no insurance) in [facility]:  Organization         Address  Phone   Notes  Sickle Cell Patients, [facility] [address], [place] [phone]   [facility] [address], [place] [phone]) [phone]   [facility] [place]  [address],  [phone]   [facility]/Dr. Osei-Bonsu  [address] [place] [address], [place] or [id number], [place] [phone] Phone number for both [facility] and  [place] locations is the same.  [facility] [address], [place] [phone]) [phone]   [facility] [place] [address] [place] [address], [place] or [address] [phone]) [phone]   [facility] [address], [place] [phone]) [phone], phone; [phone](336) [phone], fax Sees patients 1st and 3rd Saturday of every month.  Must not qualify for public or private insurance (i.e. Medicaid, Medicare, [place] Health Choice, Veterans' Benefits)  Household income should be no more than 200% of the poverty level The clinic cannot treat you if you are pregnant or think you are pregnant  Sexually transmitted diseases are not treated at the clinic.    Dental Care: Organization         Address  Phone  Notes  [facility] Department of [facility] [address], [place] [phone]) [phone] Accepts children up to age 31 who are enrolled in [place]icaid or [place] Health Choice; pregnant women with a Medicaid card; and children who have applied for Medicaid or [place] Health Choice, but were declined, whose parents can pay a reduced fee at time of service.  [facility] [place]  Department of [facility] [place]  [address], [place] [phone]) [phone] Accepts children up to age 31 who are enrolled in [place]icaid or [place] Health Choice; pregnant women with a Medicaid card; and children who have applied for Medicaid or  Health Choice, but were declined, whose parents can pay a reduced fee at time of service.  [facility]  [address], [place] [phone]) [phone] Patients are seen by appointment only. Walk-ins are not accepted. [facility] will see patients 31 years of age and older. Monday - Tuesday (8am-5pm) Most Wednesdays (8:30-5pm) $30 per visit, cash only  [facility]  [address], [facility] [phone]) [phone] Patients are seen by appointment only. Walk-ins are not accepted. [facility] will see patients 118 years of age and older. One Wednesday Evening (Monthly: Volunteer Based).  $30 per visit, cash only  [facility]  [phone]) [phone] for adults; Children under age 654, call Graduate Pediatric Dentistry at [phone]. Children aged 474-14, please call [phone] to request a pediatric application.  Dental services are provided in all areas of dental care including fillings, crowns and bridges, complete and partial dentures, implants, gum treatment, root canals, and extractions. Preventive care is also provided. Treatment is provided to both adults and children. Patients are selected via a lottery and there is often a waiting list.   [facility] [address], [place]  [phone]) [phone] [email or web address]   [facility] [address], [place], [place] [phone], Ext. [phone] Second and Fourth Thursday of each month, opens at 6:30 AM; Clinic ends at 9 AM.  Patients are seen on a first-come first-served basis, and a limited number are seen during each clinic.   [facility]  [address], [place], [place] [phone]) [phone]    Eligibility Requirements You must have lived in [place], [place], or [place] counties for at least the last three months.   You cannot be eligible for state or federal sponsored National Cityhealthcare insurance, including CIGNAVeterans Administration, [place]icaid, or [employer] EntertainmentMedicare.   You generally cannot be eligible for healthcare insurance through your employer.    How to apply: Eligibility screenings are held every Tuesday and Wednesday afternoon from 1:00 pm until 4:00 pm. You do not  need an appointment for the interview!  [facility] [address], [place], [place] [phone]   [facility]  [phone]   [facility]  [phone]   [facility]  [phone]    Behavioral Health Resources in the Community: Intensive Outpatient Programs Organization         Address  Phone  Notes  [facility] [address], [place], [place] [phone]   [facility] [address], [place], [place] [phone]   [facility] [address], [place], [place]  [phone]   [facility] [address],  [place], [place] [phone] or [phone]   Substance Abuse Resources Organization         Address  Phone  Notes  [facility]  [phone]   [facility]  [phone]   [facility]  [phone]   [facility]  [phone]   Residential & Outpatient Substance Abuse Program  [phone]   Psychological Services Organization         Address  Phone  Notes  [facility]  [phone]- [phone]   [facility]  [phone]- [phone]   [facility] [address], [place] [phone] or [phone]    Mobile Crisis Teams Organization         Address  Phone  Notes  [facility], Mobile Crisis Care Unit  [phone]   [facility]  [address]  [place], [place] [phone]   Doristine LocksSharon DeEsch [address] [place] [phone]    Self-Help/Support Groups Organization         Address  Phone             Notes  [facility]oc[facility] of [place] - variety of support groups  [phone] Call for more information  Narcotics Anonymous (NA), [facility] [address], [place]  2 meetings at this location   Statisticianesidential Treatment Programs Organization         Address  Phone  Notes  [facility] [address],    [place]  [phone]   [facility]  [address], [place] [phone], [place], [place] [phone]   [facility] [address], [place] [phone] Admissions: 8am-3pm M-F  [facility] [address]    [place], [place] [phone]   [facility] [address], [place], [place] [phone]   [facility] [address]  [place], [place] [phone]   [facility] - Intensive Outpatient [address], [place], [place] [phone]   [facility] ([facility]iction Recovery [facility] [address]  [place], [place] [phone] or [phone]   [facility] (RTS) [address] [place], [place] [phone] Accepts Medicaid  [facility] [address]  [place] [phone] Substance Abuse/Addiction Treatment   [facility] Behavioral Health Resources Organization         Address  Phone  Notes  [facility]  [phone]   Angie FavaJulie Brannon, PhD [address], [address] [place], [place]   [phone](336) [phone] or [phone]) [phone]   [facility]   [address] [place], [place] [phone]) [phone]   [facility] [address], [place], [place] [phone]) [phone] Insurance/Medicaid/sponsorship through [facility] CorporationCenter[facility]  Faith and Families [address]  [place], [place] [phone] [place], [place] [phone]    Dr. Adele Schilder  [phone]   [facility] Dept. 1) [address] [place] 2) [place] 3)  [place] [address], [place] [phone]  [phone]   [place] [phone] or [phone] (After Hours)

## 2014-02-07 NOTE — ED Notes (Signed)
Immoblizer applied to rt knee. Pt tolerated well. Resp even and unlabored. ABC's intact. NAD noted.

## 2014-02-07 NOTE — ED Provider Notes (Signed)
CSN: [id number]     Arrival date & time 02/06/14  1951 History   First MD Initiated Contact with Patient 02/07/14 0000     Chief Complaint  Patient presents with  . Knee Pain     (Consider location/radiation/quality/duration/timing/severity/associated sxs/prior Treatment) HPI  Susan Faulkner is a 33 y.o. female with past medical history of asthma coming in with right knee pain. Patient states earlier this afternoon she missed a step when walking out of her apartment and landed hard on her foot. She denies any direct trauma to her knee, she did not hit her head or lose consciousness. Patient states it is now mild swelling of the right knee and her pain is on the medial aspect. She has not taken anything for pain relief. She feels that her knee buckles when she walks. She denies any previous trauma to her knee, patient has no further complaints. She is requesting a knee immobilizer.  10 Systems reviewed and are negative for acute change except as noted in the HPI.    Past Medical History  Diagnosis Date  . Asthma   . Ectopic pregnancy    Past Surgical History  Procedure Laterality Date  . Salpingectomy     History reviewed. No pertinent family history. History  Substance Use Topics  . Smoking status: Former Games developermoker  . Smokeless tobacco: Never Used  . Alcohol Use: No     Comment: occ   OB History    No data available     Review of Systems    Allergies  Shellfish allergy and Iodine  Home Medications   Prior to Admission medications   Medication Sig Start Date End Date Taking? Authorizing Provider  acetaminophen (TYLENOL) 500 MG tablet Take 1,000 mg by mouth every 6 (six) hours as needed for mild pain.    Historical Provider, MD  albuterol (PROVENTIL HFA;VENTOLIN HFA) 108 (90 BASE) MCG/ACT inhaler Inhale 2 puffs into the lungs every 4 (four) hours as needed for wheezing or shortness of breath.    Historical Provider, MD   BP 127/66 mmHg  Pulse 99  Temp(Src) 97.8 F  (36.6 C) (Oral)  Resp 16  SpO2 97%  LMP 01/23/2014 Physical Exam  Constitutional: She is oriented to person, place, and time. She appears well-developed and well-nourished. No distress.  Obese female  HENT:  Head: Normocephalic and atraumatic.  Nose: Nose normal.  Mouth/Throat: Oropharynx is clear and moist. No oropharyngeal exudate.  Eyes: Conjunctivae and EOM are normal. Pupils are equal, round, and reactive to light. No scleral icterus.  Neck: Normal range of motion. Neck supple. No JVD present. No tracheal deviation present. No thyromegaly present.  Cardiovascular: Normal rate, regular rhythm and normal heart sounds.  Exam reveals no gallop and no friction rub.   No murmur heard. Pulmonary/Chest: Effort normal and breath sounds normal. No respiratory distress. She has no wheezes. She exhibits no tenderness.  Abdominal: Soft. Bowel sounds are normal. She exhibits no distension and no mass. There is no tenderness. There is no rebound and no guarding.  Musculoskeletal: Normal range of motion. She exhibits no edema or tenderness.  No significant swelling, normal range of motion, no tenderness to palpation. 2+ pulses and normal sensation distally.  Lymphadenopathy:    She has no cervical adenopathy.  Neurological: She is alert and oriented to person, place, and time. No cranial nerve deficit. She exhibits normal muscle tone.  Skin: Skin is warm and dry. No rash noted. She is not diaphoretic. No erythema. No  pallor.  Nursing note and vitals reviewed.   ED Course  Procedures (including critical care time) Labs Review Labs Reviewed - No data to display  Imaging Review Dg Knee Complete 4 Views Right  02/06/2014   CLINICAL DATA:  Initial encounter, right knee pain  EXAM: RIGHT KNEE - COMPLETE 4+ VIEW  COMPARISON:  None.  FINDINGS: There is no evidence of fracture, dislocation, or joint effusion. There is mild tricompartmental osteoarthritis. Soft tissues are unremarkable.  IMPRESSION: No  acute osseous injury of the right knee.   Electronically Signed   By: Elige KoHetal  Patel   On: 02/06/2014 21:54     EKG Interpretation None      MDM   Final diagnoses:  None    Patient presents emergency department out of concern for right knee pain. I do not believe x-rays are needed as there is no direct trauma to her knee, however they were obtained by triage and negative. She was advised to use ice packs, Motrin, early range of motion. PCP follow-up will be provided. Her vital signs remain within her normal limits and she is safe for discharge.    Susan CrumbleAdeleke Aundreya Souffrant, MD 02/07/14 [phone]

## 2014-04-19 ENCOUNTER — Encounter (HOSPITAL_COMMUNITY): Payer: Self-pay | Admitting: Emergency Medicine

## 2014-04-19 ENCOUNTER — Emergency Department (HOSPITAL_COMMUNITY)
Admission: EM | Admit: 2014-04-19 | Discharge: 2014-04-20 | Disposition: A | Discharge status: Home / Self Care | Payer: No Typology Code available for payment source | Attending: Emergency Medicine | Admitting: Emergency Medicine

## 2014-04-19 DIAGNOSIS — Z3202 Encounter for pregnancy test, result negative: Secondary | ICD-10-CM | POA: Insufficient documentation

## 2014-04-19 DIAGNOSIS — R109 Unspecified abdominal pain: Secondary | ICD-10-CM

## 2014-04-19 DIAGNOSIS — Z8742 Personal history of other diseases of the female genital tract: Secondary | ICD-10-CM | POA: Insufficient documentation

## 2014-04-19 DIAGNOSIS — Z79899 Other long term (current) drug therapy: Secondary | ICD-10-CM | POA: Insufficient documentation

## 2014-04-19 DIAGNOSIS — Z9079 Acquired absence of other genital organ(s): Secondary | ICD-10-CM | POA: Insufficient documentation

## 2014-04-19 DIAGNOSIS — R112 Nausea with vomiting, unspecified: Secondary | ICD-10-CM

## 2014-04-19 DIAGNOSIS — Z87891 Personal history of nicotine dependence: Secondary | ICD-10-CM | POA: Insufficient documentation

## 2014-04-19 DIAGNOSIS — J45901 Unspecified asthma with (acute) exacerbation: Secondary | ICD-10-CM | POA: Insufficient documentation

## 2014-04-19 LAB — CBC WITH DIFFERENTIAL/PLATELET
Basophils Absolute: 0 10*3/uL (ref 0.0–0.1)
Basophils Relative: 0 % (ref 0–1)
EOS ABS: 0.3 10*3/uL (ref 0.0–0.7)
EOS PCT: 5 % (ref 0–5)
HCT: 38.8 % (ref 36.0–46.0)
HEMOGLOBIN: 12.6 g/dL (ref 12.0–15.0)
LYMPHS PCT: 24 % (ref 12–46)
Lymphs Abs: 1.2 10*3/uL (ref 0.7–4.0)
MCH: 28.8 pg (ref 26.0–34.0)
MCHC: 32.5 g/dL (ref 30.0–36.0)
MCV: 88.6 fL (ref 78.0–100.0)
MONO ABS: 0.3 10*3/uL (ref 0.1–1.0)
Monocytes Relative: 7 % (ref 3–12)
NEUTROS PCT: 64 % (ref 43–77)
Neutro Abs: 3.1 10*3/uL (ref 1.7–7.7)
Platelets: 224 10*3/uL (ref 150–400)
RBC: 4.38 MIL/uL (ref 3.87–5.11)
RDW: 12.3 % (ref 11.5–15.5)
WBC: 4.9 10*3/uL (ref 4.0–10.5)

## 2014-04-19 LAB — PREGNANCY, URINE: PREG TEST UR: NEGATIVE

## 2014-04-19 LAB — COMPREHENSIVE METABOLIC PANEL
ALT: 23 U/L (ref 0–35)
ANION GAP: 12 (ref 5–15)
AST: 19 U/L (ref 0–37)
Albumin: 3.5 g/dL (ref 3.5–5.2)
Alkaline Phosphatase: 52 U/L (ref 39–117)
BILIRUBIN TOTAL: 0.4 mg/dL (ref 0.3–1.2)
BUN: 17 mg/dL (ref 6–23)
CO2: 21 mmol/L (ref 19–32)
CREATININE: 0.73 mg/dL (ref 0.50–1.10)
Calcium: 8.6 mg/dL (ref 8.4–10.5)
Chloride: 112 mmol/L (ref 96–112)
GFR calc Af Amer: >90 mL/min (ref >90)
GFR calc non Af Amer: >90 mL/min (ref >90)
GLUCOSE: 104 mg/dL — AB (ref 70–99)
Potassium: 4 mmol/L (ref 3.5–5.1)
Sodium: 145 mmol/L (ref 135–145)
TOTAL PROTEIN: 6.9 g/dL (ref 6.0–8.3)

## 2014-04-19 LAB — URINALYSIS, ROUTINE W REFLEX MICROSCOPIC
BILIRUBIN URINE: NEGATIVE
GLUCOSE, UA: NEGATIVE mg/dL
Hgb urine dipstick: NEGATIVE
Ketones, ur: NEGATIVE mg/dL
LEUKOCYTES UA: NEGATIVE
NITRITE: NEGATIVE
Protein, ur: NEGATIVE mg/dL
SPECIFIC GRAVITY, URINE: 1.036 — AB (ref 1.005–1.030)
Urobilinogen, UA: 1 mg/dL (ref 0.0–1.0)
pH: 6 (ref 5.0–8.0)

## 2014-04-19 LAB — LIPASE, BLOOD: LIPASE: 36 U/L (ref 11–59)

## 2014-04-19 MED ORDER — ONDANSETRON 8 MG PO TBDP
8.0000 mg | ORAL_TABLET | Freq: Once | ORAL | Status: AC
  Administered 2014-04-19: 8 mg via ORAL
  Filled 2014-04-19: qty 1

## 2014-04-19 NOTE — ED Notes (Signed)
Pt states she has been vomiting x 3 weeks but today she felt like "crap" along with SOB. Pt c/o lower back pain as well as generalized body aches.

## 2014-04-20 LAB — HCG, QUANTITATIVE, PREGNANCY: hCG, Beta Chain, Quant, S: <1 m[IU]/mL (ref <5)

## 2014-04-20 MED ORDER — ONDANSETRON 8 MG PO TBDP: 8.0000 mg | ORAL_TABLET | Freq: Three times a day (TID) | ORAL | Status: DC | PRN

## 2014-04-20 MED ORDER — IBUPROFEN 600 MG PO TABS: 600.0000 mg | ORAL_TABLET | Freq: Four times a day (QID) | ORAL | Status: DC | PRN

## 2014-04-20 NOTE — Discharge Instructions (Signed)
We saw you in the ER for the nausea, vomiting and back pain. All the results in the ER are normal. We are not sure what is causing your symptoms. The workup in the ER is not complete, and is limited to screening for life threatening and emergent conditions only, so please see a primary care doctor for further evaluation. Appropriate resources provided for gyne and outpatient. There is more information below.  Start with soft diet, and advance as tolerated. Please return to the ER if your symptoms worsen; you have increased pain, fevers, chills, inability to keep any medications down, confusion. Otherwise see the outpatient doctor as requested.  Soft-Food Meal Plan A soft-food meal plan includes foods that are safe and easy to swallow. This meal plan typically is used:  If you are having trouble chewing or swallowing foods.  As a transition meal plan after only having had liquid meals for a long period. WHAT DO I NEED TO KNOW ABOUT THE SOFT-FOOD MEAL PLAN? A soft-food meal plan includes tender foods that are soft and easy to chew and swallow. In most cases, bite-sized pieces of food are easier to swallow. A bite-sized piece is about  inch or smaller. Foods in this plan do not need to be ground or pureed. Foods that are very hard, crunchy, or sticky should be avoided. Also, breads, cereals, yogurts, and desserts with nuts, seeds, or fruits should be avoided. WHAT FOODS CAN I EAT? Grains Rice and wild rice. Moist bread, dressing, pasta, and noodles. Well-moistened dry or cooked cereals, such as farina (cooked wheat cereal), oatmeal, or grits. Biscuits, breads, muffins, pancakes, and waffles that have been well moistened. Vegetables Shredded lettuce. Cooked, tender vegetables, including potatoes without skins. Vegetable juices. Broths or creamed soups made with vegetables that are not stringy or chewy. Strained tomatoes (without seeds). Fruits Canned or well-cooked fruits. Soft (ripe), peeled  fresh fruits, such as peaches, nectarines, kiwi, cantaloupe, honeydew melon, and watermelon (without seeds). Soft berries with small seeds, such as strawberries. Fruit juices (without pulp). Meats and Other Protein Sources Moist, tender, lean beef. Mutton. Lamb. Veal. Chicken. [place]. Liver. Ham. Fish without bones. Eggs. Dairy Milk, milk drinks, and cream. Plain cream cheese and cottage cheese. Plain yogurt. Sweets/Desserts Flavored gelatin desserts. Custard. Plain ice cream, frozen yogurt, sherbet, milk shakes, and malts. Plain cakes and cookies. Plain hard candy.  Other Butter, margarine (without trans fat), and cooking oils. Mayonnaise. Cream sauces. Mild spices, salt, and sugar. Syrup, molasses, honey, and jelly. The items listed above may not be a complete list of recommended foods or beverages. Contact your dietitian for more options. WHAT FOODS ARE NOT RECOMMENDED? Grains Dry bread, toast, crackers that have not been moistened. Coarse or dry cereals, such as bran, granola, and shredded wheat. Tough or chewy crusty breads, such as [place] bread or baguettes. Vegetables Corn. Raw vegetables except shredded lettuce. Cooked vegetables that are tough or stringy. Tough, crisp, fried potatoes and potato skins. Fruits Fresh fruits with skins or seeds or both, such as apples, pears, or grapes. Stringy, high-pulp fruits, such as papaya, pineapple, coconut, or mango. Fruit leather, fruit roll-ups, and all dried fruits. Meats and Other Protein Sources Sausages and hot dogs. Meats with gristle. Fish with bones. Nuts, seeds, and chunky peanut or other nut butters. Sweets/Desserts Cakes or cookies that are very dry or chewy.  The items listed above may not be a complete list of foods and beverages to avoid. Contact your dietitian for more information. Document Released: 06/19/2007 Document  Revised: 03/17/2013 Document Reviewed: 02/06/2013 [facility] Patient Information 2015 [place], [place]. This  information is not intended to replace advice given to you by your health care provider. Make sure you discuss any questions you have with your health care provider.    Abdominal Pain, Women Abdominal (stomach, pelvic, or belly) pain can be caused by many things. It is important to tell your doctor:  The location of the pain.  Does it come and go or is it present all the time?  Are there things that start the pain (eating certain foods, exercise)?  Are there other symptoms associated with the pain (fever, nausea, vomiting, diarrhea)? All of this is helpful to know when trying to find the cause of the pain. CAUSES   Stomach: virus or bacteria infection, or ulcer.  Intestine: appendicitis (inflamed appendix), regional ileitis (Crohn's disease), ulcerative colitis (inflamed colon), irritable bowel syndrome, diverticulitis (inflamed diverticulum of the colon), or cancer of the stomach or intestine.  Gallbladder disease or stones in the gallbladder.  Kidney disease, kidney stones, or infection.  Pancreas infection or cancer.  Fibromyalgia (pain disorder).  Diseases of the female organs:  Uterus: fibroid (non-cancerous) tumors or infection.  Fallopian tubes: infection or tubal pregnancy.  Ovary: cysts or tumors.  Pelvic adhesions (scar tissue).  Endometriosis (uterus lining tissue growing in the pelvis and on the pelvic organs).  Pelvic congestion syndrome (female organs filling up with blood just before the menstrual period).  Pain with the menstrual period.  Pain with ovulation (producing an egg).  Pain with an IUD (intrauterine device, birth control) in the uterus.  Cancer of the female organs.  Functional pain (pain not caused by a disease, may improve without treatment).  Psychological pain.  Depression. DIAGNOSIS  Your doctor will decide the seriousness of your pain by doing an examination.  Blood tests.  X-rays.  Ultrasound.  CT scan (computed  tomography, special type of X-ray).  MRI (magnetic resonance imaging).  Cultures, for infection.  Barium enema (dye inserted in the large intestine, to better view it with X-rays).  Colonoscopy (looking in intestine with a lighted tube).  Laparoscopy (minor surgery, looking in abdomen with a lighted tube).  Major abdominal exploratory surgery (looking in abdomen with a large incision). TREATMENT  The treatment will depend on the cause of the pain.   Many cases can be observed and treated at home.  Over-the-counter medicines recommended by your caregiver.  Prescription medicine.  Antibiotics, for infection.  Birth control pills, for painful periods or for ovulation pain.  Hormone treatment, for endometriosis.  Nerve blocking injections.  Physical therapy.  Antidepressants.  Counseling with a psychologist or psychiatrist.  Minor or major surgery. HOME CARE INSTRUCTIONS   Do not take laxatives, unless directed by your caregiver.  Take over-the-counter pain medicine only if ordered by your caregiver. Do not take aspirin because it can cause an upset stomach or bleeding.  Try a clear liquid diet (broth or water) as ordered by your caregiver. Slowly move to a bland diet, as tolerated, if the pain is related to the stomach or intestine.  Have a thermometer and take your temperature several times a day, and record it.  Bed rest and sleep, if it helps the pain.  Avoid sexual intercourse, if it causes pain.  Avoid stressful situations.  Keep your follow-up appointments and tests, as your caregiver orders.  If the pain does not go away with medicine or surgery, you may try:  Acupuncture.  Relaxation exercises (yoga, meditation).  Group therapy.  Counseling. SEEK MEDICAL CARE IF:   You notice certain foods cause stomach pain.  Your home care treatment is not helping your pain.  You need stronger pain medicine.  You want your IUD removed.  You feel faint  or lightheaded.  You develop nausea and vomiting.  You develop a rash.  You are having side effects or an allergy to your medicine. SEEK IMMEDIATE MEDICAL CARE IF:   Your pain does not go away or gets worse.  You have a fever.  Your pain is felt only in portions of the abdomen. The right side could possibly be appendicitis. The left lower portion of the abdomen could be colitis or diverticulitis.  You are passing blood in your stools (bright red or black tarry stools, with or without vomiting).  You have blood in your urine.  You develop chills, with or without a fever.  You pass out. MAKE SURE YOU:   Understand these instructions.  Will watch your condition.  Will get help right away if you are not doing well or get worse. Document Released: 01/07/2007 Document Revised: 07/27/2013 Document Reviewed: 01/27/2009 [facility] Patient Information 2015 [place], [place]. This information is not intended to replace advice given to you by your health care provider. Make sure you discuss any questions you have with your health care provider.  Flank Pain Flank pain refers to pain that is located on the side of the body between the upper abdomen and the back. The pain may occur over a short period of time (acute) or may be long-term or reoccurring (chronic). It may be mild or severe. Flank pain can be caused by many things. CAUSES  Some of the more common causes of flank pain include:  Muscle strains.   Muscle spasms.   A disease of your spine (vertebral disk disease).   A lung infection (pneumonia).   Fluid around your lungs (pulmonary edema).   A kidney infection.   Kidney stones.   A very painful skin rash caused by the chickenpox virus (shingles).   Gallbladder disease.  HOME CARE INSTRUCTIONS  Home care will depend on the cause of your pain. In general,  Rest as directed by your caregiver.  Drink enough fluids to keep your urine clear or pale yellow.  Only  take over-the-counter or prescription medicines as directed by your caregiver. Some medicines may help relieve the pain.  Tell your caregiver about any changes in your pain.  Follow up with your caregiver as directed. SEEK IMMEDIATE MEDICAL CARE IF:   Your pain is not controlled with medicine.   You have new or worsening symptoms.  Your pain increases.   You have abdominal pain.   You have shortness of breath.   You have persistent nausea or vomiting.   You have swelling in your abdomen.   You feel faint or pass out.   You have blood in your urine.  You have a fever or persistent symptoms for more than 2-3 days.  You have a fever and your symptoms suddenly get worse. MAKE SURE YOU:   Understand these instructions.  Will watch your condition.  Will get help right away if you are not doing well or get worse. Document Released: 05/03/2005 Document Revised: 12/05/2011 Document Reviewed: 10/25/2011 [facility] Patient Information 2015 [place], [place]. This information is not intended to replace advice given to you by your health care provider. Make sure you discuss any questions you have with your health care provider.  Nausea and Vomiting  Nausea means you feel sick to your stomach. Throwing up (vomiting) is a reflex where stomach contents come out of your mouth. HOME CARE   Take medicine as told by your doctor.  Do not force yourself to eat. However, you do need to drink fluids.  If you feel like eating, eat a normal diet as told by your doctor.  Eat rice, wheat, potatoes, bread, lean meats, yogurt, fruits, and vegetables.  Avoid high-fat foods.  Drink enough fluids to keep your pee (urine) clear or pale yellow.  Ask your doctor how to replace body fluid losses (rehydrate). Signs of body fluid loss (dehydration) include:  Feeling very thirsty.  Dry lips and mouth.  Feeling dizzy.  Dark pee.  Peeing less than normal.  Feeling confused.  Fast  breathing or heart rate. GET HELP RIGHT AWAY IF:   You have blood in your throw up.  You have black or bloody poop (stool).  You have a bad headache or stiff neck.  You feel confused.  You have bad belly (abdominal) pain.  You have chest pain or trouble breathing.  You do not pee at least once every 8 hours.  You have cold, clammy skin.  You keep throwing up after 24 to 48 hours.  You have a fever. MAKE SURE YOU:   Understand these instructions.  Will watch your condition.  Will get help right away if you are not doing well or get worse. Document Released: 08/29/2007 Document Revised: 06/04/2011 Document Reviewed: 08/11/2010 [facility] Patient Information 2015 [place], [place]. This information is not intended to replace advice given to you by your health care provider. Make sure you discuss any questions you have with your health care provider.  RESOURCE GUIDE  Chronic Pain Problems: Contact [facility]  [phone] Patients need to be referred by their primary care doctor.  Insufficient Money for Medicine: Contact [employer]:  call "211."   No Primary Care Doctor: - Call Health Connect  [phone] - can help you locate a primary care doctor that  accepts your insurance, provides certain services, etc. - Physician Referral Service- [phone]  Agencies that provide inexpensive medical care: - [facility]  [phone] - [facility]  [phone] - [facility]  [phone] - Women's Clinic  [phone] - [facility]  [phone] [facility]  [phone]  Medicaid-accepting [facility] Providers: - [facility]- [address]  [phone], Mon-Fri 9am-7pm, Sat 9am-1pm - [facility]- [address] [place], [address] [place]  [phone] - [facility]- [address], [address] [place]  [phone] [facility]- [address]  [phone] - Renaye Rakers- [address] [place], [address], [phone]  Only accepts [place] patients after they have their name  applied to their card  Self Pay (no insurance) in [place]: - Sickle Cell Patients: Dr Willey Blade, [facility]  [address] [place], [phone] - [facility]- [address] [place]  [phone]       [facility] [place]- [address]       -     [facility]- see information above (Speak to [employer] if you do not have insurance)       -  [facility]- [address],  [phone]       -  [facility]- [address] [phone]       -  Dr Julio Sicks-  [address], [address], [place], [phone]       -  [facility] - [address] [phone]       -  [facility]- [address] [phone], also [address] [phone]       -    [facility]- [address] [place], [phone], 1st & 3rd Saturday        every month, 10am-1pm  [facility] [address] [place], [place] [address] [phone]  [facility] [address] [place], [place] [address] [phone]  1) Find a Doctor and Pay Out of Pocket Although you won't have to find out who is covered by your insurance plan, it is a good idea to ask around and get recommendations. You will then need to call the office and see if the doctor you have chosen will accept you as a new patient and what types of options they offer for patients who are self-pay. Some doctors offer discounts or will set up payment plans for their patients who do not have insurance, but you will need to ask so you aren't surprised when you get to your appointment.  2) Contact Your Local Health Department Not all health departments have doctors that can see patients for sick visits, but many do, so it is worth a call to see if yours does.  If you don't know where your local health department is, you can check in your phone book. The CDC also has a tool to help you locate your state's health department, and many state websites also have listings of all of their local health departments.  3) Find a Walk-in Clinic If your illness is not likely to be very severe or complicated, you may want to try a walk in clinic. These are popping up all over the country in pharmacies, drugstores, and shopping centers. They're usually staffed by nurse practitioners or physician assistants that have been trained to treat common illnesses and complaints. They're usually fairly quick and inexpensive. However, if you have serious medical issues or chronic medical problems, these are probably not your best option  STD Testing - [facility] Department of [facility] [place], STD Clinic, [address] [place], phone [phone] or [phone].  Monday - Friday, call for an appointment. [facility] Department of [employer], STD Clinic, [address], [place], phone [phone] or [phone].  Monday - Friday, call for an appointment.  Abuse/Neglect: [facility] Child Abuse Hotline [phone] [phone] [facility] Child Abuse Hotline 304 096 7000 (After Hours)  Emergency Shelter:  [facility] [phone]  Maternity Homes: - [facility] [place] [phone] - [facility] [phone]  MRSA Hotline #:   (249)417-3787  Dental Assistance If unable to pay or uninsured, contact:  [facility]. to become qualified for the adult dental clinic.  Patients with Medicaid: [facility] [phone] [address], [phone] [address], [phone]  If unable to pay, or uninsured, contact [facility] [phone] in [place], [phone] in [facility]) to become qualified for the adult dental clinic  [facility] [address] [place], [place] [address] [phone] [email or web address]  Other Proofreader Services: - [facility]- [address] [place], [place], [address], [phone], Ext. [phone], 2nd and 4th Thursday of the month at 6:30am.  10 clients each day by appointment, can sometimes see walk-in patients if someone does not show for an appointment. [facility]- [address] [place], [place], [address], [phone] - [facility]- [address], [place], [place], [address], [phone] - [facility]- [phone] [facility] Department- [phone] [facility] Department- [phone]

## 2014-04-20 NOTE — ED Provider Notes (Addendum)
CSN: [id number]     Arrival date & time 04/19/14  [id number] History   First MD Initiated Contact with Patient 04/19/14 2258     Chief Complaint  Patient presents with  . Shortness of Breath  . Emesis  . Generalized Body Aches     (Consider location/radiation/quality/duration/timing/severity/associated sxs/prior Treatment) HPI Comments: Pt comes in with cc of emesis and L flank pain. Pt has hx of ovarian cyst and PID (when she was 19). She reports that she has been having nausea and emesis since christmas time. However, for the past 3 days she has been having emesis more constantly, about 3-4 times/day. She has constant nausea. LMP (she thinks)was 2 weeks ago. No uti like sx, no vaginal discharge, bleeding. Pt has no anterior abd pain. No hx of renal stones, and any pelvic problems besides pid and ectopic. Pt's ectopic pregnancy initially presented with nausea, and then led to abd pain - so she is concerned about ectopic.  Patient is a 34 y.o. female presenting with shortness of breath and vomiting. The history is provided by the patient.  Shortness of Breath Associated symptoms: vomiting   Associated symptoms: no abdominal pain, no chest pain, no cough, no fever, no neck pain and no wheezing   Emesis Associated symptoms: no abdominal pain and no diarrhea     Past Medical History  Diagnosis Date  . Asthma   . Ectopic pregnancy    Past Surgical History  Procedure Laterality Date  . Salpingectomy     No family history on file. History  Substance Use Topics  . Smoking status: Former Games developermoker  . Smokeless tobacco: Never Used  . Alcohol Use: No     Comment: occ   OB History    No data available     Review of Systems  Constitutional: Negative for fever and activity change.  HENT: Negative for facial swelling.   Respiratory: Positive for shortness of breath. Negative for cough and wheezing.   Cardiovascular: Negative for chest pain.  Gastrointestinal: Positive for nausea and  vomiting. Negative for abdominal pain, diarrhea, constipation, blood in stool and abdominal distention.  Genitourinary: Positive for flank pain. Negative for dysuria, frequency, hematuria, vaginal bleeding, vaginal discharge, difficulty urinating and pelvic pain.  Musculoskeletal: Negative for neck pain.  Skin: Negative for color change.  Allergic/Immunologic: Negative for immunocompromised state.  Neurological: Negative for speech difficulty.  Hematological: Does not bruise/bleed easily.  Psychiatric/Behavioral: Negative for confusion.      Allergies  Shellfish allergy and Iodine  Home Medications   Prior to Admission medications   Medication Sig Start Date End Date Taking? Authorizing Provider  albuterol (PROVENTIL HFA;VENTOLIN HFA) 108 (90 BASE) MCG/ACT inhaler Inhale 2 puffs into the lungs every 4 (four) hours as needed for wheezing or shortness of breath.   Yes Historical Provider, MD  ibuprofen (ADVIL,MOTRIN) 600 MG tablet Take 1 tablet (600 mg total) by mouth every 6 (six) hours as needed. 04/20/14   Derwood KaplanAnkit Katyana Trolinger, MD  ondansetron (ZOFRAN ODT) 8 MG disintegrating tablet Take 1 tablet (8 mg total) by mouth every 8 (eight) hours as needed for nausea. 04/20/14   Kaydon Creedon, MD   BP 130/56 mmHg  Pulse 84  Temp(Src) 98.3 F (36.8 C) (Oral)  Resp 20  SpO2 96%  LMP 04/11/2014 (Approximate) Physical Exam  Constitutional: She is oriented to person, place, and time. She appears well-developed and well-nourished.  HENT:  Head: Normocephalic and atraumatic.  Eyes: EOM are normal. Pupils are equal, round, and reactive  to light.  Neck: Neck supple.  Cardiovascular: Normal rate, regular rhythm and normal heart sounds.   No murmur heard. Pulmonary/Chest: Effort normal. No respiratory distress.  Abdominal: Soft. She exhibits no distension. There is no tenderness. There is no rebound and no guarding.  Genitourinary:  L flank tenderness, worse with palpation. No rash, no ecchymoses.   Neurological: She is alert and oriented to person, place, and time.  Skin: Skin is warm and dry.  Nursing note and vitals reviewed.   ED Course  Procedures (including critical care time) Labs Review Labs Reviewed  COMPREHENSIVE METABOLIC PANEL - Abnormal; Notable for the following:    Glucose, Bld 104 (*)    All other components within normal limits  URINALYSIS, ROUTINE W REFLEX MICROSCOPIC - Abnormal; Notable for the following:    Specific Gravity, Urine 1.036 (*)    All other components within normal limits  CBC WITH DIFFERENTIAL/PLATELET  LIPASE, BLOOD  PREGNANCY, URINE  HCG, QUANTITATIVE, PREGNANCY    Imaging Review No results found.   EKG Interpretation None      MDM   Final diagnoses:  Left flank pain  Nausea and vomiting in adult    Pt comes in with cc of flank pain. Hx of Ectopic. Pt has no abd pain, u preg is neg, and all labs are WNL. She reports having nausea for few days now, and her ectopic started with emesis.  Will get Hcg Quant. Pt reports neg urine preg at home when she had ectopic and is not certain that she had an actual period 2 weeks ago. Urine hcg has FALSE negative, and it will be prudent to r/o ectopic for this pt, as she is having unprotected intercourse.  Derwood Kaplan, MD 04/20/14 0006  12:57 AM Labs reassuring, po challenge passed. Will d.c  Derwood Kaplan, MD 04/20/14 [phone]

## 2014-10-08 ENCOUNTER — Encounter (HOSPITAL_COMMUNITY): Payer: Self-pay | Admitting: Oncology

## 2014-10-08 ENCOUNTER — Emergency Department (HOSPITAL_COMMUNITY)
Admission: EM | Admit: 2014-10-08 | Discharge: 2014-10-08 | Disposition: A | Discharge status: Home / Self Care | Payer: Self-pay | Attending: Emergency Medicine | Admitting: Emergency Medicine

## 2014-10-08 DIAGNOSIS — Y998 Other external cause status: Secondary | ICD-10-CM | POA: Insufficient documentation

## 2014-10-08 DIAGNOSIS — Y92009 Unspecified place in unspecified non-institutional (private) residence as the place of occurrence of the external cause: Secondary | ICD-10-CM | POA: Insufficient documentation

## 2014-10-08 DIAGNOSIS — Z87891 Personal history of nicotine dependence: Secondary | ICD-10-CM | POA: Insufficient documentation

## 2014-10-08 DIAGNOSIS — W108XXA Fall (on) (from) other stairs and steps, initial encounter: Secondary | ICD-10-CM | POA: Insufficient documentation

## 2014-10-08 DIAGNOSIS — Y9301 Activity, walking, marching and hiking: Secondary | ICD-10-CM | POA: Insufficient documentation

## 2014-10-08 DIAGNOSIS — Z79899 Other long term (current) drug therapy: Secondary | ICD-10-CM | POA: Insufficient documentation

## 2014-10-08 DIAGNOSIS — S0990XA Unspecified injury of head, initial encounter: Secondary | ICD-10-CM | POA: Insufficient documentation

## 2014-10-08 DIAGNOSIS — J45909 Unspecified asthma, uncomplicated: Secondary | ICD-10-CM | POA: Insufficient documentation

## 2014-10-08 DIAGNOSIS — S0083XA Contusion of other part of head, initial encounter: Secondary | ICD-10-CM | POA: Insufficient documentation

## 2014-10-08 MED ORDER — IBUPROFEN 200 MG PO TABS
600.0000 mg | ORAL_TABLET | Freq: Once | ORAL | Status: AC
  Administered 2014-10-08: 600 mg via ORAL
  Filled 2014-10-08: qty 3

## 2014-10-08 MED ORDER — OXYCODONE-ACETAMINOPHEN 5-325 MG PO TABS
1.0000 | ORAL_TABLET | Freq: Once | ORAL | Status: AC
  Administered 2014-10-08: 1 via ORAL
  Filled 2014-10-08: qty 1

## 2014-10-08 MED ORDER — ONDANSETRON 4 MG PO TBDP
4.0000 mg | ORAL_TABLET | Freq: Once | ORAL | Status: AC
  Administered 2014-10-08: 4 mg via ORAL
  Filled 2014-10-08: qty 1

## 2014-10-08 NOTE — ED Notes (Signed)
Per pt she was walking up the steps when her flip flop broke causing her to fall and hit her head on cement.  Pt states + LOC.  +nausea.  Pt is A&O x 4.

## 2014-10-08 NOTE — Discharge Instructions (Signed)
Head Injury  You have a head injury. Headaches and throwing up (vomiting) are common after a head injury. It should be easy to wake up from sleeping. Sometimes you must stay in the hospital. Most problems happen within the first 24 hours. Side effects may occur up to 7-10 days after the injury.   WHAT ARE THE TYPES OF HEAD INJURIES?  Head injuries can be as minor as a bump. Some head injuries can be more severe. More severe head injuries include:  · A jarring injury to the brain (concussion).  · A bruise of the brain (contusion). This mean there is bleeding in the brain that can cause swelling.  · A cracked skull (skull fracture).  · Bleeding in the brain that collects, clots, and forms a bump (hematoma).  WHEN SHOULD I GET HELP RIGHT AWAY?   · You are confused or sleepy.  · You cannot be woken up.  · You feel sick to your stomach (nauseous) or keep throwing up (vomiting).  · Your dizziness or unsteadiness is getting worse.  · You have very bad, lasting headaches that are not helped by medicine. Take medicines only as told by your doctor.  · You cannot use your arms or legs like normal.  · You cannot walk.  · You notice changes in the black spots in the center of the colored part of your eye (pupil).  · You have clear or bloody fluid coming from your nose or ears.  · You have trouble seeing.  During the next 24 hours after the injury, you must stay with someone who can watch you. This person should get help right away (call 911 in the [place] if you start to shake and are not able to control it (have seizures), you pass out, or you are unable to wake up.  HOW CAN I PREVENT A HEAD INJURY IN THE FUTURE?  · Wear seat belts.  · Wear a helmet while bike riding and playing sports like football.  · Stay away from dangerous activities around the house.  WHEN CAN I RETURN TO NORMAL ACTIVITIES AND ATHLETICS?  See your doctor before doing these activities. You should not do normal activities or play contact sports until 1 week  after the following symptoms have stopped:  · Headache that does not go away.  · Dizziness.  · Poor attention.  · Confusion.  · Memory problems.  · Sickness to your stomach or throwing up.  · Tiredness.  · Fussiness.  · Bothered by bright lights or loud noises.  · Anxiousness or depression.  · Restless sleep.  MAKE SURE YOU:   · Understand these instructions.  · Will watch your condition.  · Will get help right away if you are not doing well or get worse.  Document Released: 02/23/2008 Document Revised: 07/27/2013 Document Reviewed: 11/17/2012  [employer]® Patient Information ©2015 [employer]. This information is not intended to replace advice given to you by your health care provider. Make sure you discuss any questions you have with your health care provider.

## 2014-10-08 NOTE — ED Notes (Signed)
Pt c/o right anterior headache after a fall at home at around 5:30pm today.  She states that she did "black out" for a few seconds and felt dizzy and nauseated for a while after regaining consciousness, which is what made her decide to come to the ED for evaluation.  She is still feeling nauseous in waves and her pain is ranging from 5/10-9/10 in waves as well.  She has not actually vomited since the fall.  Her pupils are 2mm and briskly reactive bilaterally.  Equally strong hand grips bilaterally.  Equally strong plantar flexion and extension bilaterally.  Normal sensation in all extremities.  Pt denies vision changes, no loss of coordination beyond the immediate injury.  There is a visible knot in her right temple area that measures about 5cm in diameter and is mildly swollen and very painful to the touch.  She also scraped a small area on her left foot but denies other physical injury.

## 2014-10-14 NOTE — ED Provider Notes (Signed)
CSN: [id number]     Arrival date & time 10/08/14  1859 History   First MD Initiated Contact with Patient 10/08/14 1927     Chief Complaint  Patient presents with  . Fall    hit head +LOC     (Consider location/radiation/quality/duration/timing/severity/associated sxs/prior Treatment) HPI   34 year old female presenting after mechanical fall where she struck her head. Patient was walking in flip-flops when she stepped awkwardly and lost her balance. She did strike her head. Possible loss of consciousness briefly. Nauseated afterwards which has since resolved. No vomiting. No acute visual changes. No acute numbness, tingling loss of strength. Denies any pain aside from the area of the forehead she hit it. No neck or back pain. No blood thinners.  Past Medical History  Diagnosis Date  . Asthma   . Ectopic pregnancy    Past Surgical History  Procedure Laterality Date  . Salpingectomy     No family history on file. History  Substance Use Topics  . Smoking status: Former Games developer  . Smokeless tobacco: Never Used  . Alcohol Use: No     Comment: occ   OB History    No data available     Review of Systems  All systems reviewed and negative, other than as noted in HPI.   Allergies  Shellfish allergy and Iodine  Home Medications   Prior to Admission medications   Medication Sig Start Date End Date Taking? Authorizing Provider  albuterol (PROVENTIL HFA;VENTOLIN HFA) 108 (90 BASE) MCG/ACT inhaler Inhale 2 puffs into the lungs every 4 (four) hours as needed for wheezing or shortness of breath.   Yes Historical Provider, MD  diphenhydrAMINE (BENADRYL) 25 mg capsule Take 25 mg by mouth every 6 (six) hours as needed for allergies.   Yes Historical Provider, MD  ibuprofen (ADVIL,MOTRIN) 200 MG tablet Take 200 mg by mouth every 6 (six) hours as needed for mild pain or cramping.   Yes Historical Provider, MD  ibuprofen (ADVIL,MOTRIN) 600 MG tablet Take 1 tablet (600 mg total) by mouth  every 6 (six) hours as needed. Patient not taking: Reported on 10/08/2014 04/20/14   Derwood Kaplan, MD  ondansetron (ZOFRAN ODT) 8 MG disintegrating tablet Take 1 tablet (8 mg total) by mouth every 8 (eight) hours as needed for nausea. Patient not taking: Reported on 10/08/2014 04/20/14   Derwood Kaplan, MD   BP 126/69 mmHg  Pulse 109  Temp(Src) 98.3 F (36.8 C) (Oral)  Resp 18  Ht  (1.651 m)  Wt 285 lb (129.275 kg)  BMI 47.43 kg/m2  SpO2 98%  LMP 09/24/2014 (Exact Date) Physical Exam  Constitutional: She is oriented to person, place, and time. She appears well-developed and well-nourished. No distress.  HENT:  Head: Normocephalic and atraumatic.  Small hematoma to right forehead. Minimally tender.  Eyes: Conjunctivae are normal. Right eye exhibits no discharge. Left eye exhibits no discharge.  Neck: Neck supple.  No midline spinal tenderness  Cardiovascular: Normal rate, regular rhythm and normal heart sounds.  Exam reveals no gallop and no friction rub.   No murmur heard. Pulmonary/Chest: Effort normal and breath sounds normal. No respiratory distress.  Abdominal: Soft. She exhibits no distension. There is no tenderness.  Musculoskeletal: She exhibits no edema or tenderness.  Neurological: She is alert and oriented to person, place, and time. No cranial nerve deficit. She exhibits normal muscle tone. Coordination normal.  Speech clear. Content appropriate. Follows commands. Cranial nerves II through XII are intact. Strength is 5 out  of 5 bilateral upper and lower extremities. Sensation is intact to light touch. Steady gait.  Skin: Skin is warm and dry.  Psychiatric: She has a normal mood and affect. Her behavior is normal. Thought content normal.  Nursing note and vitals reviewed.   ED Course  Procedures (including critical care time) Labs Review Labs Reviewed - No data to display  Imaging Review No results found.   EKG Interpretation None      MDM   Final  diagnoses:  Closed head injury, initial encounter    34 year old female with headache after striking her head. No particularly concerning features. No loss of consciousness. No acute neurological complaints. Nonfocal neurological examination. No blood thinners. Low suspicion for serious traumatic injury. Imaging deferred. Plan symptomatic treatment. Return precautions were discussed.    Raeford Razor, MD 10/14/14 1102

## 2014-11-20 ENCOUNTER — Emergency Department (HOSPITAL_COMMUNITY)
Admission: EM | Admit: 2014-11-20 | Discharge: 2014-11-20 | Disposition: A | Discharge status: Home / Self Care | Payer: Self-pay | Attending: Emergency Medicine | Admitting: Emergency Medicine

## 2014-11-20 ENCOUNTER — Encounter (HOSPITAL_COMMUNITY): Payer: Self-pay | Admitting: Emergency Medicine

## 2014-11-20 DIAGNOSIS — E669 Obesity, unspecified: Secondary | ICD-10-CM | POA: Insufficient documentation

## 2014-11-20 DIAGNOSIS — Z3202 Encounter for pregnancy test, result negative: Secondary | ICD-10-CM | POA: Insufficient documentation

## 2014-11-20 DIAGNOSIS — Z79899 Other long term (current) drug therapy: Secondary | ICD-10-CM | POA: Insufficient documentation

## 2014-11-20 DIAGNOSIS — Z202 Contact with and (suspected) exposure to infections with a predominantly sexual mode of transmission: Secondary | ICD-10-CM | POA: Insufficient documentation

## 2014-11-20 DIAGNOSIS — Z87891 Personal history of nicotine dependence: Secondary | ICD-10-CM | POA: Insufficient documentation

## 2014-11-20 DIAGNOSIS — J45909 Unspecified asthma, uncomplicated: Secondary | ICD-10-CM | POA: Insufficient documentation

## 2014-11-20 LAB — PREGNANCY, URINE: PREG TEST UR: NEGATIVE

## 2014-11-20 LAB — URINE MICROSCOPIC-ADD ON

## 2014-11-20 LAB — URINALYSIS, ROUTINE W REFLEX MICROSCOPIC
Bilirubin Urine: NEGATIVE
Glucose, UA: 100 mg/dL — AB
Ketones, ur: NEGATIVE mg/dL
Nitrite: NEGATIVE
PROTEIN: NEGATIVE mg/dL
SPECIFIC GRAVITY, URINE: 1.026 (ref 1.005–1.030)
Urobilinogen, UA: 1 mg/dL (ref 0.0–1.0)
pH: 6.5 (ref 5.0–8.0)

## 2014-11-20 LAB — WET PREP, GENITAL
Trich, Wet Prep: NONE SEEN
Yeast Wet Prep HPF POC: NONE SEEN

## 2014-11-20 MED ORDER — AZITHROMYCIN 250 MG PO TABS
1000.0000 mg | ORAL_TABLET | Freq: Once | ORAL | Status: AC
  Administered 2014-11-20: 1000 mg via ORAL
  Filled 2014-11-20: qty 4

## 2014-11-20 MED ORDER — LIDOCAINE HCL (PF) 1 % IJ SOLN
INTRAMUSCULAR | Status: AC
Start: 2014-11-20 — End: 2014-11-20
  Administered 2014-11-20: 5 mL
  Filled 2014-11-20: qty 5

## 2014-11-20 MED ORDER — CEFTRIAXONE SODIUM 250 MG IJ SOLR
250.0000 mg | Freq: Once | INTRAMUSCULAR | Status: AC
  Administered 2014-11-20: 250 mg via INTRAMUSCULAR
  Filled 2014-11-20: qty 250

## 2014-11-20 NOTE — Discharge Instructions (Signed)
You were evaluated in the ED today for your exposure to a sexual transmitted disease. You were treated empirically with ceftriaxone and azithromycin for possible gonorrhea and Chlamydia exposure. Please follow-up with your doctor as needed. Return to ED for new or worsening symptoms.  Sexually Transmitted Disease A sexually transmitted disease (STD) is a disease or infection that may be passed (transmitted) from person to person, usually during sexual activity. This may happen by way of saliva, semen, blood, vaginal mucus, or urine. Common STDs include:   Gonorrhea.   Chlamydia.   Syphilis.   HIV and AIDS.   Genital herpes.   Hepatitis B and C.   Trichomonas.   Human papillomavirus (HPV).   Pubic lice.   Scabies.  Mites.  Bacterial vaginosis. WHAT ARE CAUSES OF STDs? An STD may be caused by bacteria, a virus, or parasites. STDs are often transmitted during sexual activity if one person is infected. However, they may also be transmitted through nonsexual means. STDs may be transmitted after:   Sexual intercourse with an infected person.   Sharing sex toys with an infected person.   Sharing needles with an infected person or using unclean piercing or tattoo needles.  Having intimate contact with the genitals, mouth, or rectal areas of an infected person.   Exposure to infected fluids during birth. WHAT ARE THE SIGNS AND SYMPTOMS OF STDs? Different STDs have different symptoms. Some people may not have any symptoms. If symptoms are present, they may include:   Painful or bloody urination.   Pain in the pelvis, abdomen, vagina, anus, throat, or eyes.   A skin rash, itching, or irritation.  Growths, ulcerations, blisters, or sores in the genital and anal areas.  Abnormal vaginal discharge with or without bad odor.   Penile discharge in men.   Fever.   Pain or bleeding during sexual intercourse.   Swollen glands in the groin area.   Yellow  skin and eyes (jaundice). This is seen with hepatitis.   Swollen testicles.  Infertility.  Sores and blisters in the mouth. HOW ARE STDs DIAGNOSED? To make a diagnosis, your health care provider may:   Take a medical history.   Perform a physical exam.   Take a sample of any discharge to examine.  Swab the throat, cervix, opening to the penis, rectum, or vagina for testing.  Test a sample of your first morning urine.   Perform blood tests.   Perform a Pap test, if this applies.   Perform a colposcopy.   Perform a laparoscopy.  HOW ARE STDs TREATED? Treatment depends on the STD. Some STDs may be treated but not cured.   Chlamydia, gonorrhea, trichomonas, and syphilis can be cured with antibiotic medicine.   Genital herpes, hepatitis, and HIV can be treated, but not cured, with prescribed medicines. The medicines lessen symptoms.   Genital warts from HPV can be treated with medicine or by freezing, burning (electrocautery), or surgery. Warts may come back.   HPV cannot be cured with medicine or surgery. However, abnormal areas may be removed from the cervix, vagina, or vulva.   If your diagnosis is confirmed, your recent sexual partners need treatment. This is true even if they are symptom-free or have a negative culture or evaluation. They should not have sex until their health care providers say it is okay. HOW CAN I REDUCE MY RISK OF GETTING AN STD? Take these steps to reduce your risk of getting an STD:  Use latex condoms, dental dams, and  water-soluble lubricants during sexual activity. Do not use petroleum jelly or oils.  Avoid having multiple sex partners.  Do not have sex with someone who has other sex partners.  Do not have sex with anyone you do not know or who is at high risk for an STD.  Avoid risky sex practices that can break your skin.  Do not have sex if you have open sores on your mouth or skin.  Avoid drinking too much alcohol or  taking illegal drugs. Alcohol and drugs can affect your judgment and put you in a vulnerable position.  Avoid engaging in oral and anal sex acts.  Get vaccinated for HPV and hepatitis. If you have not received these vaccines in the past, talk to your health care provider about whether one or both might be right for you.   If you are at risk of being infected with HIV, it is recommended that you take a prescription medicine daily to prevent HIV infection. This is called pre-exposure prophylaxis (PrEP). You are considered at risk if:  You are a man who has sex with other men (MSM).  You are a heterosexual man or woman and are sexually active with more than one partner.  You take drugs by injection.  You are sexually active with a partner who has HIV.  Talk with your health care provider about whether you are at high risk of being infected with HIV. If you choose to begin PrEP, you should first be tested for HIV. You should then be tested every 3 months for as long as you are taking PrEP.  WHAT SHOULD I DO IF I THINK I HAVE AN STD?  See your health care provider.   Tell your sexual partner(s). They should be tested and treated for any STDs.  Do not have sex until your health care provider says it is okay. WHEN SHOULD I GET IMMEDIATE MEDICAL CARE? Contact your health care provider right away if:   You have severe abdominal pain.  You are a man and notice swelling or pain in your testicles.  You are a woman and notice swelling or pain in your vagina. Document Released: 06/02/2002 Document Revised: 03/17/2013 Document Reviewed: 09/30/2012 [facility] Patient Information 2015 [place], [place]. This information is not intended to replace advice given to you by your health care provider. Make sure you discuss any questions you have with your health care provider.

## 2014-11-20 NOTE — ED Notes (Signed)
Pt states that her fiance brought her a card from the health department stating that pt needs to be tested/treated for gonorrhea bc she has been exposed. Pt states that she has had vaginal discharge and odor for a while but thought it was nothing like this.

## 2014-11-20 NOTE — ED Provider Notes (Signed)
CSN: [id number]     Arrival date & time 11/20/14  1001 History   First MD Initiated Contact with Patient 11/20/14 1025     Chief Complaint  Patient presents with  . Vaginal Discharge     (Consider location/radiation/quality/duration/timing/severity/associated sxs/prior Treatment) HPI Susan Faulkner is a 34 y.o. female with history of asthma, ectopic pregnancy, comes in for evaluation of vaginal discharge and possible STD exposure. Patient states that her fianc brought her a card from the health department stating that patient needs to be treated for gonorrhea. Patient does report intermittent abdominal discomfort similar to menstrual cramping with associated vaginal discharge that is "white creamy". Denies any fevers, chills, nausea or vomiting, diarrhea or constipation, urinary symptoms. Patient does report that her "cycle just started today and that may be why I am cramping". No other aggravating or modifying factors.  Past Medical History  Diagnosis Date  . Asthma   . Ectopic pregnancy    Past Surgical History  Procedure Laterality Date  . Salpingectomy     No family history on file. Social History  Substance Use Topics  . Smoking status: Former Games developer  . Smokeless tobacco: Never Used  . Alcohol Use: No     Comment: occ   OB History    No data available     Review of Systems A 10 point review of systems was completed and was negative except for pertinent positives and negatives as mentioned in the history of present illness     Allergies  Shellfish allergy and Iodine  Home Medications   Prior to Admission medications   Medication Sig Start Date End Date Taking? Authorizing Provider  albuterol (PROVENTIL HFA;VENTOLIN HFA) 108 (90 BASE) MCG/ACT inhaler Inhale 2 puffs into the lungs every 4 (four) hours as needed for wheezing or shortness of breath.   Yes Historical Provider, MD  ibuprofen (ADVIL,MOTRIN) 200 MG tablet Take 800 mg by mouth every 6 (six) hours as  needed for fever, headache, mild pain, moderate pain or cramping.    Yes Historical Provider, MD   BP 128/69 mmHg  Pulse 95  Temp(Src) 97.8 F (36.6 C) (Oral)  Resp 18  SpO2 99%  LMP 11/20/2014 Physical Exam  Constitutional: She is oriented to person, place, and time. She appears well-developed and well-nourished.  Obese  HENT:  Head: Normocephalic and atraumatic.  Mouth/Throat: Oropharynx is clear and moist.  Eyes: Conjunctivae are normal. Pupils are equal, round, and reactive to light. Right eye exhibits no discharge. Left eye exhibits no discharge. No scleral icterus.  Neck: Neck supple.  Cardiovascular: Normal rate, regular rhythm and normal heart sounds.   Pulmonary/Chest: Effort normal and breath sounds normal. No respiratory distress. She has no wheezes. She has no rales.  Abdominal: Soft. There is no tenderness.  Genitourinary:  Chaperone was present for the entire genital exam. No lesions or rashes appreciated on vulva. Cervix visualized on speculum exam and appropriate cultures sampled. Some blood in vaginal vault. Discharge: Some mucopurulent discharge from cervix Upon bi manual exam- No TTP of the adnexa, no cervical motion tenderness. No fullness or masses appreciated. No abnormalities appreciated in structural anatomy.   Musculoskeletal: She exhibits no tenderness.  Neurological: She is alert and oriented to person, place, and time.  Cranial Nerves II-XII grossly intact  Skin: Skin is warm and dry. No rash noted.  Psychiatric: She has a normal mood and affect.  Nursing note and vitals reviewed.   ED Course  Procedures (including critical care time) Labs  Review Labs Reviewed  WET PREP, GENITAL - Abnormal; Notable for the following:    Clue Cells Wet Prep HPF POC FEW (*)    WBC, Wet Prep HPF POC MANY (*)    All other components within normal limits  URINALYSIS, ROUTINE W REFLEX MICROSCOPIC (NOT AT [facility]) - Abnormal; Notable for the following:    APPearance  CLOUDY (*)    Glucose, UA 100 (*)    Hgb urine dipstick LARGE (*)    Leukocytes, UA SMALL (*)    All other components within normal limits  URINE MICROSCOPIC-ADD ON - Abnormal; Notable for the following:    Bacteria, UA FEW (*)    All other components within normal limits  PREGNANCY, URINE  HIV ANTIBODY (ROUTINE TESTING)  GC/CHLAMYDIA PROBE AMP ([place]) NOT AT [facility]    Imaging Review No results found. I have personally reviewed and evaluated these images and lab results as part of my medical decision-making.   EKG Interpretation None     Meds given in ED:  Medications  cefTRIAXone (ROCEPHIN) injection 250 mg (250 mg Intramuscular Given 11/20/14 1059)  azithromycin (ZITHROMAX) tablet 1,000 mg (1,000 mg Oral Given 11/20/14 1059)  lidocaine (PF) (XYLOCAINE) 1 % injection (5 mLs  Given 11/20/14 1059)    New Prescriptions   No medications on file   Filed Vitals:   11/20/14 1015  BP: 128/69  Pulse: 95  Temp: 97.8 F (36.6 C)  TempSrc: Oral  Resp: 18  SpO2: 99%    MDM  Vitals stable - WNL -afebrile Pt resting comfortably in ED. PE--benign abdominal exam. Physical exam is otherwise not concerning. Labwork--no evidence of UTI.   DDX--patient here for possible exposure to gonorrhea or chlamydia. benign abdominal exam, no cervical or adnexal tenderness, low suspicion for PID. Has had bilateral salpingectomies, doubt TOA. There is mucopurulent discharge from cervix, will treat for GC chlamydia. No evidence of other acute or emergent pathology at this time. Patient stable for discharge and is appropriate follow-up with PCP.  I discussed all relevant lab findings and imaging results with pt and they verbalized understanding. Discussed f/u with PCP within 48 hrs and return precautions, pt very amenable to plan. ] Final diagnoses:  Exposure to STD        Joycie Peek, PA-C 11/20/14 1209  Cathren Laine, MD 11/21/14 [phone]

## 2014-11-21 LAB — HIV ANTIBODY (ROUTINE TESTING W REFLEX): HIV Screen 4th Generation wRfx: NONREACTIVE

## 2014-11-22 LAB — GC/CHLAMYDIA PROBE AMP (~~LOC~~) NOT AT ARMC
Chlamydia: NEGATIVE
Neisseria Gonorrhea: POSITIVE — AB

## 2014-11-24 ENCOUNTER — Telehealth (HOSPITAL_COMMUNITY): Payer: Self-pay | Admitting: Emergency Medicine

## 2014-11-25 ENCOUNTER — Telehealth (HOSPITAL_COMMUNITY): Payer: Self-pay

## 2015-01-02 ENCOUNTER — Telehealth (HOSPITAL_COMMUNITY): Payer: Self-pay

## 2015-01-02 NOTE — Telephone Encounter (Signed)
Unable to reach by phone or mail.  Chart closed.   

## 2015-03-12 IMAGING — CT CT HEAD W/O CM
2 series · 17 of 30 positions shown, 20 images · non-contrast
Comparison: 02/05/2013

CLINICAL DATA: Headache

EXAM:
CT HEAD WITHOUT CONTRAST
TECHNIQUE: Contiguous axial images were obtained from the base of the skull
through the vertex without intravenous contrast.

[Series 2: head w/o · axial · non-contrast · 0.48mm/px · z∈[-110,+0]mm · 9 of 28 slices shown, 12 images]
[im 3/28  brain]
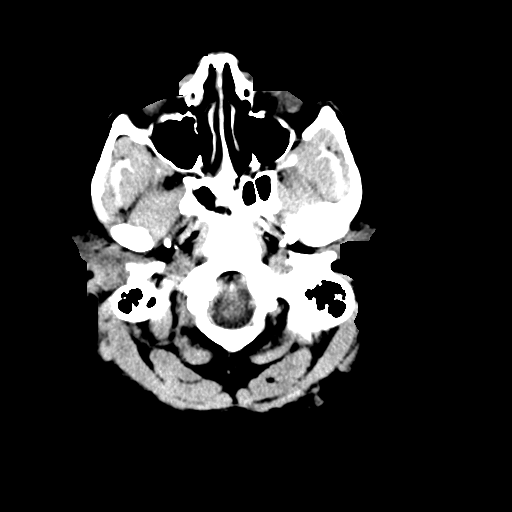
[im 3/28  bone]
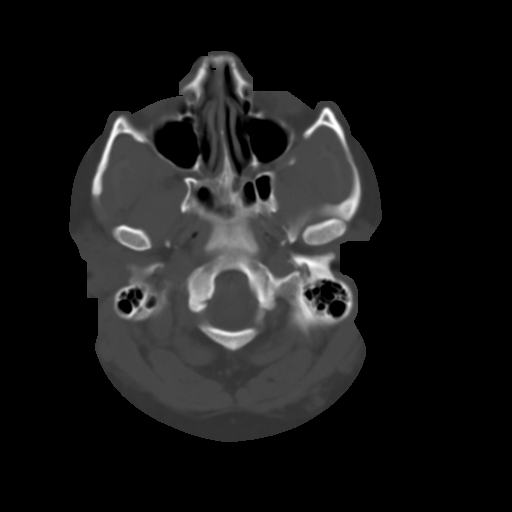
[im 6/28  brain]
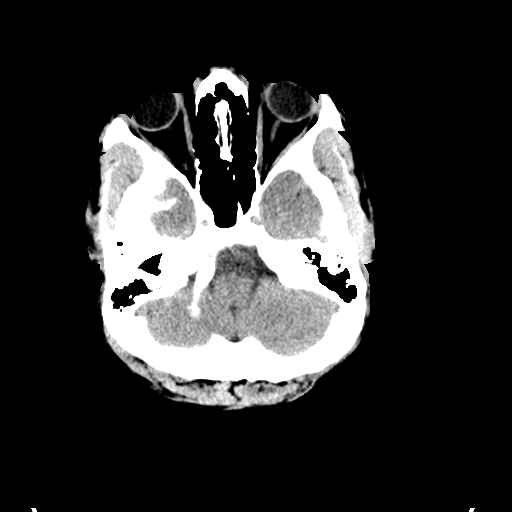
[im 9/28  brain]
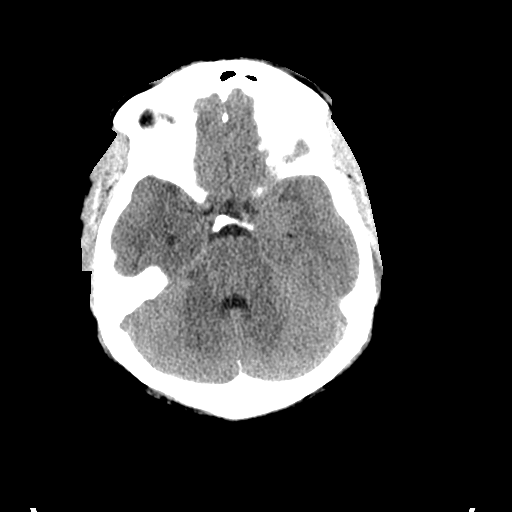
[im 11/28  brain]
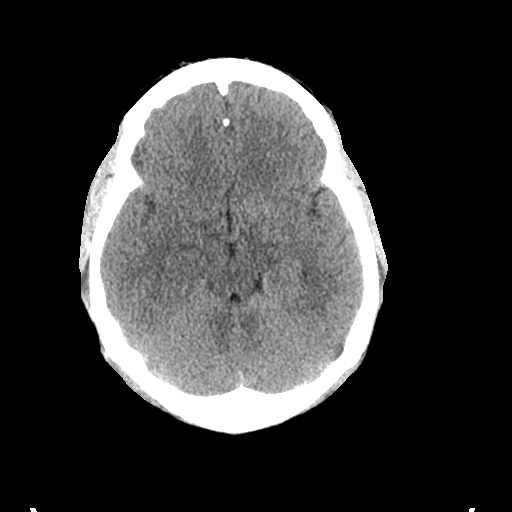
[im 14/28  brain]
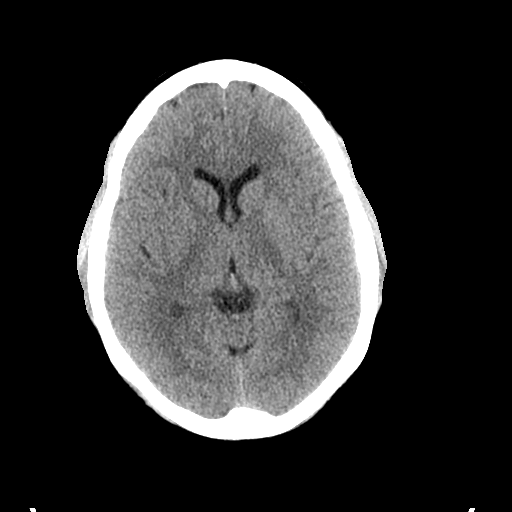
[im 14/28  bone]
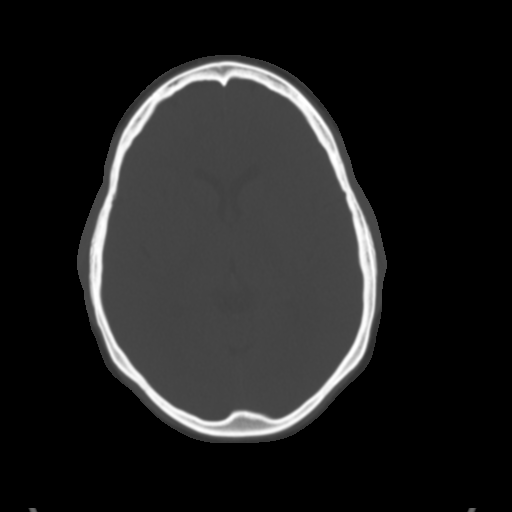
[im 17/28  brain]
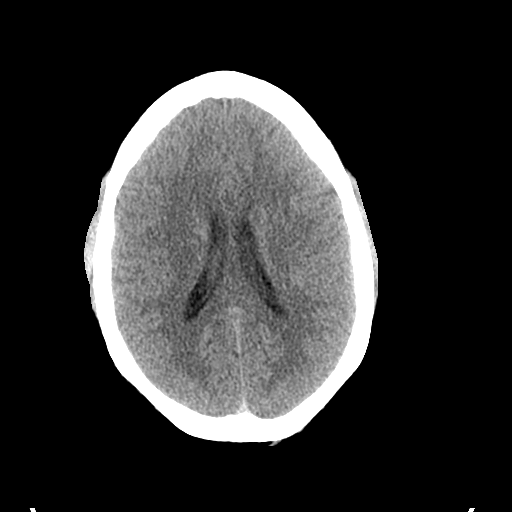
[im 19/28  brain]
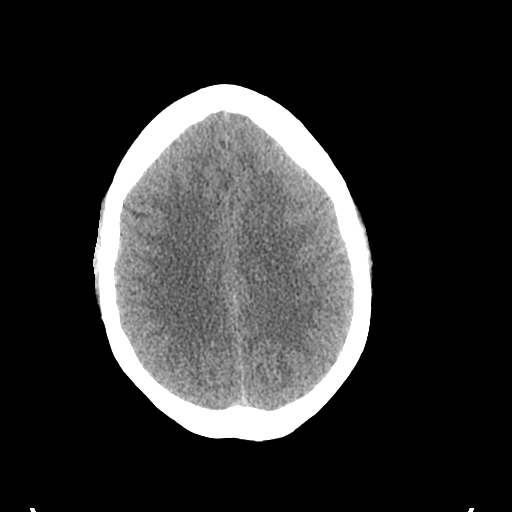
[im 22/28  brain]
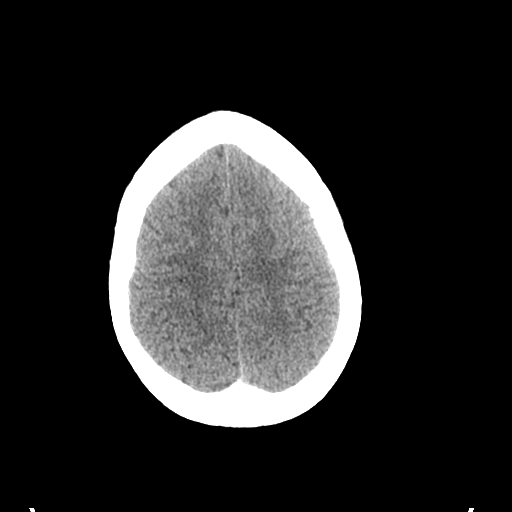
[im 25/28  brain]
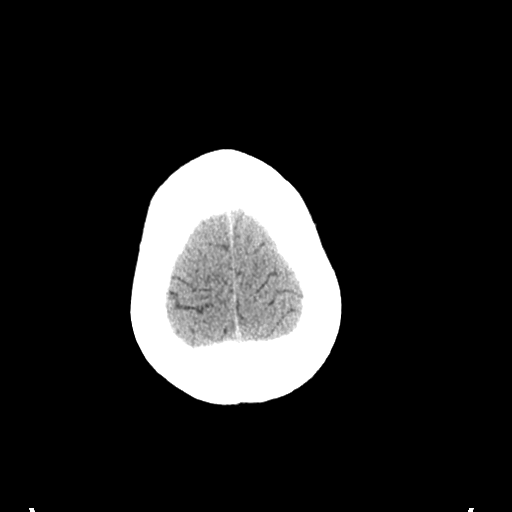
[im 25/28  bone]
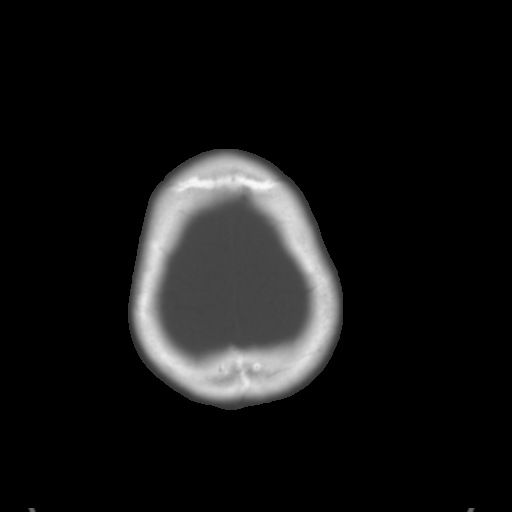

[Series 3: bone windows · axial · 0.48mm/px · z∈[-105,+0]mm · 8 of 46 slices shown]
[im 6/46  bone]
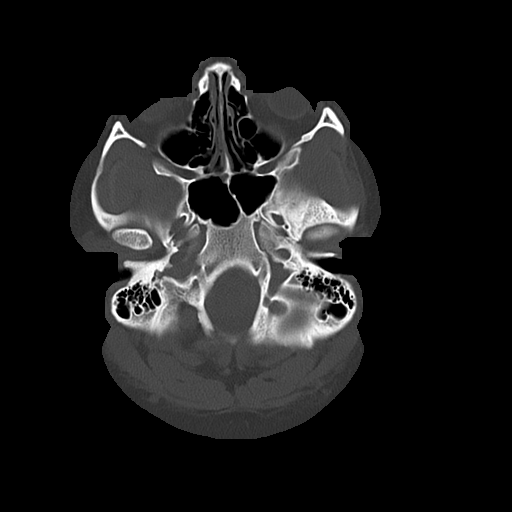
[im 11/46  bone]
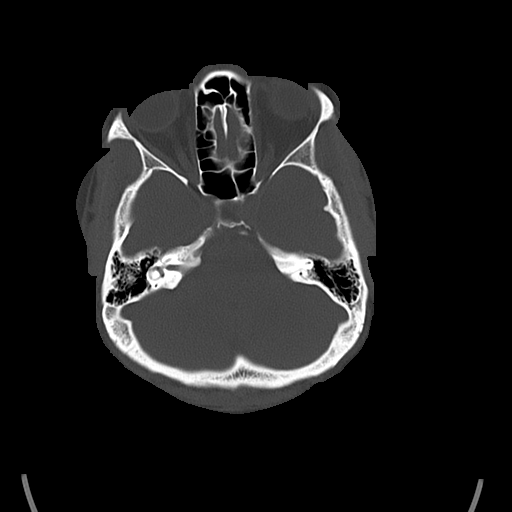
[im 16/46  bone]
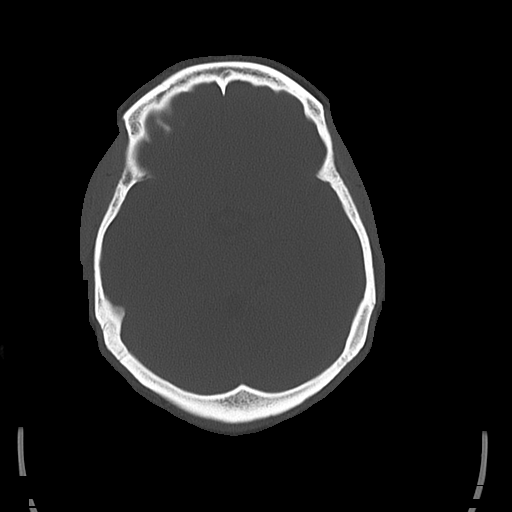
[im 21/46  bone]
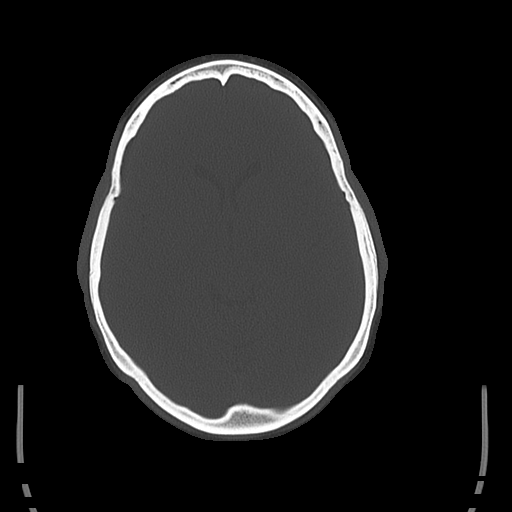
[im 26/46  bone]
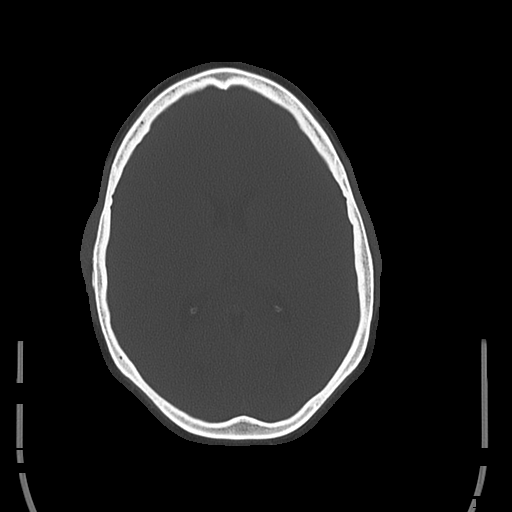
[im 31/46  bone]
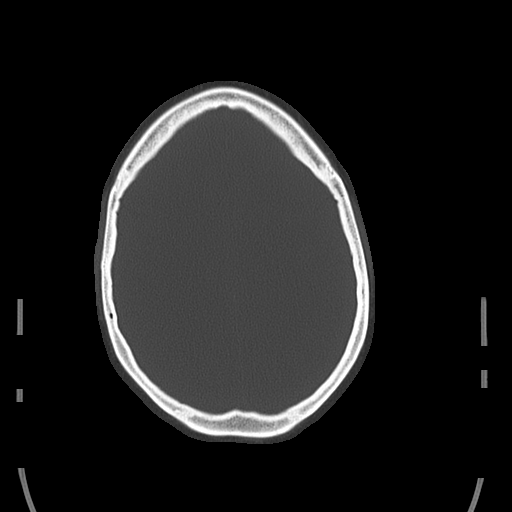
[im 36/46  bone]
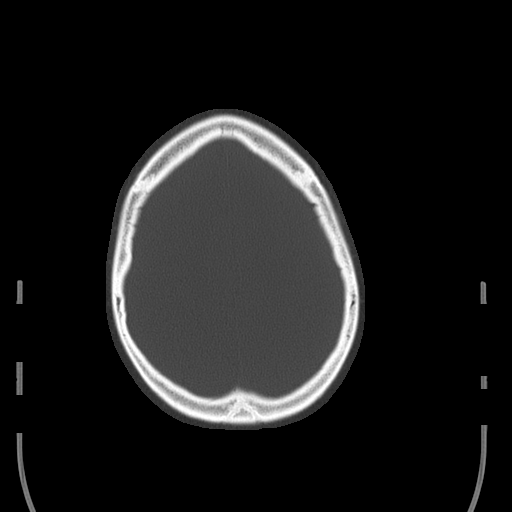
[im 41/46  bone]
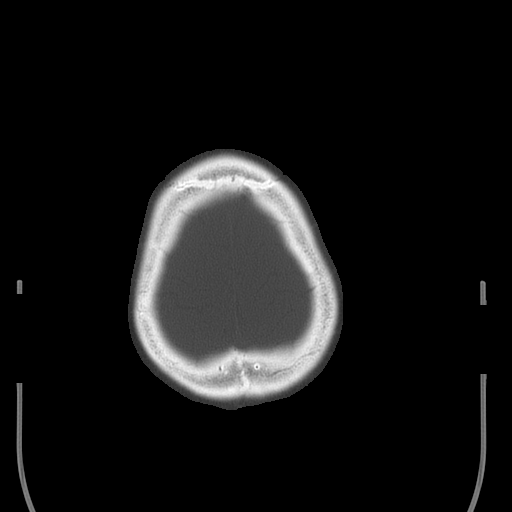

[17 of 30 positions shown; findings below may reference images not displayed]

FINDINGS: Skull and Sinuses:No significant abnormality.

Orbits: No acute abnormality.

Brain: No evidence of acute abnormality, such as acute infarction,
hemorrhage, hydrocephalus, or mass lesion/mass effect.
IMPRESSION: Negative head CT.

## 2015-08-24 ENCOUNTER — Emergency Department (HOSPITAL_COMMUNITY): Payer: Self-pay

## 2015-08-24 ENCOUNTER — Encounter (HOSPITAL_COMMUNITY): Payer: Self-pay | Admitting: *Deleted

## 2015-08-24 ENCOUNTER — Emergency Department (HOSPITAL_COMMUNITY)
Admission: EM | Admit: 2015-08-24 | Discharge: 2015-08-24 | Disposition: A | Discharge status: Home / Self Care | Payer: Self-pay | Attending: Emergency Medicine | Admitting: Emergency Medicine

## 2015-08-24 DIAGNOSIS — R112 Nausea with vomiting, unspecified: Secondary | ICD-10-CM | POA: Insufficient documentation

## 2015-08-24 DIAGNOSIS — Z6841 Body Mass Index (BMI) 40.0 and over, adult: Secondary | ICD-10-CM | POA: Insufficient documentation

## 2015-08-24 DIAGNOSIS — R1030 Lower abdominal pain, unspecified: Secondary | ICD-10-CM

## 2015-08-24 DIAGNOSIS — E669 Obesity, unspecified: Secondary | ICD-10-CM | POA: Insufficient documentation

## 2015-08-24 DIAGNOSIS — Z87891 Personal history of nicotine dependence: Secondary | ICD-10-CM | POA: Insufficient documentation

## 2015-08-24 DIAGNOSIS — J45909 Unspecified asthma, uncomplicated: Secondary | ICD-10-CM | POA: Insufficient documentation

## 2015-08-24 LAB — LIPASE, BLOOD: Lipase: 47 U/L (ref 11–51)

## 2015-08-24 LAB — COMPREHENSIVE METABOLIC PANEL
ALBUMIN: 3.5 g/dL (ref 3.5–5.0)
ALK PHOS: 43 U/L (ref 38–126)
ALT: 18 U/L (ref 14–54)
AST: 15 U/L (ref 15–41)
Anion gap: 5 (ref 5–15)
BUN: 16 mg/dL (ref 6–20)
CO2: 24 mmol/L (ref 22–32)
CREATININE: 0.81 mg/dL (ref 0.44–1.00)
Calcium: 8.7 mg/dL — ABNORMAL LOW (ref 8.9–10.3)
Chloride: 107 mmol/L (ref 101–111)
GFR calc Af Amer: >60 mL/min (ref >60)
GFR calc non Af Amer: >60 mL/min (ref >60)
GLUCOSE: 107 mg/dL — AB (ref 65–99)
Potassium: 3.8 mmol/L (ref 3.5–5.1)
SODIUM: 136 mmol/L (ref 135–145)
Total Bilirubin: 0.5 mg/dL (ref 0.3–1.2)
Total Protein: 6.8 g/dL (ref 6.5–8.1)

## 2015-08-24 LAB — URINALYSIS, ROUTINE W REFLEX MICROSCOPIC
BILIRUBIN URINE: NEGATIVE
Glucose, UA: 100 mg/dL — AB
HGB URINE DIPSTICK: NEGATIVE
Ketones, ur: NEGATIVE mg/dL
Leukocytes, UA: NEGATIVE
Nitrite: NEGATIVE
PH: 7 (ref 5.0–8.0)
Protein, ur: NEGATIVE mg/dL
Specific Gravity, Urine: 1.028 (ref 1.005–1.030)

## 2015-08-24 LAB — CBC
HEMATOCRIT: 37.7 % (ref 36.0–46.0)
Hemoglobin: 11.9 g/dL — ABNORMAL LOW (ref 12.0–15.0)
MCH: 27.7 pg (ref 26.0–34.0)
MCHC: 31.6 g/dL (ref 30.0–36.0)
MCV: 87.7 fL (ref 78.0–100.0)
Platelets: 257 10*3/uL (ref 150–400)
RBC: 4.3 MIL/uL (ref 3.87–5.11)
RDW: 12.8 % (ref 11.5–15.5)
WBC: 8.2 10*3/uL (ref 4.0–10.5)

## 2015-08-24 LAB — I-STAT BETA HCG BLOOD, ED (MC, WL, AP ONLY): I-stat hCG, quantitative: <5 m[IU]/mL (ref <5)

## 2015-08-24 MED ORDER — GI COCKTAIL ~~LOC~~
30.0000 mL | Freq: Once | ORAL | Status: AC
  Administered 2015-08-24: 30 mL via ORAL
  Filled 2015-08-24: qty 30

## 2015-08-24 MED ORDER — HYDROCODONE-ACETAMINOPHEN 5-325 MG PO TABS
1.0000 | ORAL_TABLET | Freq: Once | ORAL | Status: AC
  Administered 2015-08-24: 1 via ORAL
  Filled 2015-08-24: qty 1

## 2015-08-24 MED ORDER — DICYCLOMINE HCL 20 MG PO TABS: 20.0000 mg | ORAL_TABLET | Freq: Two times a day (BID) | ORAL | Status: DC

## 2015-08-24 MED ORDER — ONDANSETRON 4 MG PO TBDP
4.0000 mg | ORAL_TABLET | Freq: Once | ORAL | Status: AC
  Administered 2015-08-24: 4 mg via ORAL
  Filled 2015-08-24: qty 1

## 2015-08-24 MED ORDER — ONDANSETRON 4 MG PO TBDP: 4.0000 mg | ORAL_TABLET | Freq: Three times a day (TID) | ORAL | Status: DC | PRN

## 2015-08-24 NOTE — ED Notes (Signed)
In and out Cath not needed.  Pt just needed to be ask to give a sample.

## 2015-08-24 NOTE — Discharge Instructions (Signed)
You were seen in the emergency room today for evaluation of abdominal pain. Your workup was normal and your exam was reassuring. Take medications as prescribed. Return to the emergency room for worsening condition or new concerning symptoms. Follow up with your regular doctor. If you don't have a regular doctor use one of the numbers below to establish a primary care doctor.   Emergency Department Resource Guide 1) Find a Doctor and Pay Out of Pocket Although you won't have to find out who is covered by your insurance plan, it is a good idea to ask around and get recommendations. You will then need to call the office and see if the doctor you have chosen will accept you as a new patient and what types of options they offer for patients who are self-pay. Some doctors offer discounts or will set up payment plans for their patients who do not have insurance, but you will need to ask so you aren't surprised when you get to your appointment.  2) Contact Your Local Health Department Not all health departments have doctors that can see patients for sick visits, but many do, so it is worth a call to see if yours does. If you don't know where your local health department is, you can check in your phone book. The CDC also has a tool to help you locate your state's health department, and many state websites also have listings of all of their local health departments.  3) Find a Walk-in Clinic If your illness is not likely to be very severe or complicated, you may want to try a walk in clinic. These are popping up all over the country in pharmacies, drugstores, and shopping centers. They're usually staffed by nurse practitioners or physician assistants that have been trained to treat common illnesses and complaints. They're usually fairly quick and inexpensive. However, if you have serious medical issues or chronic medical problems, these are probably not your best option.  No Primary Care Doctor: - Call Health  Connect at  [phone] - they can help you locate a primary care doctor that  accepts your insurance, provides certain services, etc. - Physician Referral [phone]- [phone]  Emergency Department Resource Guide 1) Find a Doctor and Pay Out of Pocket Although you won't have to find out who is covered by your insurance plan, it is a good idea to ask around and get recommendations. You will then need to call the office and see if the doctor you have chosen will accept you as a new patient and what types of options they offer for patients who are self-pay. Some doctors offer discounts or will set up payment plans for their patients who do not have insurance, but you will need to ask so you aren't surprised when you get to your appointment.  2) Contact Your Local Health Department Not all health departments have doctors that can see patients for sick visits, but many do, so it is worth a call to see if yours does. If you don't know where your local health department is, you can check in your phone book. The CDC also has a tool to help you locate your state's health department, and many state websites also have listings of all of their local health departments.  3) Find a Walk-in Clinic If your illness is not likely to be very severe or complicated, you may want to try a walk in clinic. These are popping up all over the country in pharmacies, drugstores, and shopping centers.  They're usually staffed by nurse practitioners or physician assistants that have been trained to treat common illnesses and complaints. They're usually fairly quick and inexpensive. However, if you have serious medical issues or chronic medical problems, these are probably not your best option.  No Primary Care Doctor: - Call Health Connect at  [phone] - they can help you locate a primary care doctor that  accepts your insurance, provides certain services, etc. - Physician Referral Service- [phone]  Chronic Pain  Problems: Organization         Address  Phone   Notes  [facility]  [phone] Patients need to be referred by their primary care doctor.   Medication Assistance: Organization         Address  Phone   Notes  [facility] [address] [place], [place] [address] [phone] --Must be a resident of [facility] [place] [facility] -- Must have NO insurance coverage whatsoever (no Medicaid/ Medicare, etc.) -- The pt. MUST have a primary care doctor that directs their care regularly and follows them in the community   [facility]  [phone]   [employer]  [phone]    Agencies that provide inexpensive medical care: Organization         Address  Phone   Notes  [place]  [phone]   [facility]    [phone]   [facility] [place], [place] [address] [phone]   [place] [address] [place] [phone]   [facility]    [phone]   [facility]    [phone]   [place] [address], [place] Phone:  [phone], Fax:  [phone] Hours of Operation:  9 am - 6 pm, M-F.  Also accepts Medicaid/Medicare and self-pay.  [facility] for [place], [address], [place] Phone: [phone], Fax: [phone]. Hours of Operation:  8:30 am - 5:30 pm, M-F.  Also accepts Medicaid and self-pay.  [facility] [place] [address] [place] Phone: [phone]   [place], [place], [place] [phone], Ext. [phone] Mondays & Thursdays: 7-9 AM.  First 15 patients are seen on a first come, first serve basis.    [place] Providers:  Organization         Address  Phone   Notes  [facility] [address], [place] [phone] Also  accepts self-pay patients.  [facility] [id number] [place], [place]  [phone]   [place], [address], [place] [phone]   [facility] [address] [place] [phone]   Lucianne Lei [address], [place]   [phone] Only accepts [place] patients after they have their name applied to their card.   Self-Pay (no insurance) in [place]:  Organization         Address  Phone   Notes  Sickle Cell Patients, [facility] [place] [phone]   [facility] [place] [phone]   [facility] [place]  [place] [address], [place], [place] [phone]  [facility]/Dr. Osei-Bonsu  [address] [place] [address], [place] or [address], [place] [phone] Phone number for both [place] and [place] locations is the same.  [facility]gent [facility] and [facility] [address], [place] [phone]   [facility] [place] [address] [place] [address], [place] or [address] [phone]   [facility] [address], [place] [phone], phone; [phone], fax Sees patients 1st and 3rd Saturday of every month.  Must not qualify for public or private insurance (i.e. Medicaid, Medicare, [place] Health Choice, Veterans' Benefits)  Household income should be no more than 200% of the poverty level The clinic cannot treat you if you are pregnant or think you are pregnant  Sexually transmitted diseases are not treated at the clinic.

## 2015-08-24 NOTE — ED Notes (Signed)
Pt states that she woke up 30 min ago and felt like she needed to have a BM; pt states that she went to the [facility] and had a BM but began to have sharp lower abd pain; pt states that she vomited x 1 and felt sweaty; pt c/o pain to lower abd still

## 2015-08-24 NOTE — ED Provider Notes (Signed)
CSN: [id number]     Arrival date & time 08/24/15  0020 History   First MD Initiated Contact with Patient 08/24/15 [phone]     Chief Complaint  Patient presents with  . Abdominal Pain     HPI   Susan Faulkner is an 35 y.o. female with history of asthma who presents to the ED for evaluation of abdominal pain. She states she was in her usual state of health until this morning around 1 AM when she felt like she needed to have a BM. States after having a normal, soft BM she had sudden onset diffuse lower abdominal pain. She states the pain was sharp, stabbing, severe. She states it was so bad it made her sweaty. She reports associated nausea and one episode of NBNB emesis. She states in the ED now the pain is down to a 5/10 and her nausea is minimal. She denies dysuria, urinary frequency/urgency, vaginal symptoms. Denies fever, chills.   Past Medical History  Diagnosis Date  . Asthma   . Ectopic pregnancy    Past Surgical History  Procedure Laterality Date  . Salpingectomy     No family history on file. Social History  Substance Use Topics  . Smoking status: Former Games developer  . Smokeless tobacco: Never Used  . Alcohol Use: No     Comment: occ   OB History    No data available     Review of Systems  All other systems reviewed and are negative.     Allergies  Shellfish allergy and Iodine  Home Medications   Prior to Admission medications   Medication Sig Start Date End Date Taking? Authorizing Provider  albuterol (PROVENTIL HFA;VENTOLIN HFA) 108 (90 BASE) MCG/ACT inhaler Inhale 2 puffs into the lungs every 4 (four) hours as needed for wheezing or shortness of breath.   Yes Historical Provider, MD  ibuprofen (ADVIL,MOTRIN) 200 MG tablet Take 800 mg by mouth every 6 (six) hours as needed for fever, headache, mild pain, moderate pain or cramping.    Yes Historical Provider, MD  miconazole (MONISTAT 7) 2 % vaginal cream Place 1 Applicatorful vaginally at bedtime.   Yes Historical  Provider, MD  dicyclomine (BENTYL) 20 MG tablet Take 1 tablet (20 mg total) by mouth 2 (two) times daily. 08/24/15   Ace Gins Benn Tarver, PA-C  ondansetron (ZOFRAN ODT) 4 MG disintegrating tablet Take 1 tablet (4 mg total) by mouth every 8 (eight) hours as needed for nausea or vomiting. 08/24/15   Ace Gins Enedelia Martorelli, PA-C   BP 137/72 mmHg  Pulse 65  Temp(Src) 98.3 F (36.8 C) (Oral)  Resp 18  Ht  (1.651 m)  Wt 124.286 kg  BMI 45.60 kg/m2  SpO2 100%  LMP 07/26/2015 Physical Exam  Constitutional: She is oriented to person, place, and time.  Obese, NAD  HENT:  Right Ear: External ear normal.  Left Ear: External ear normal.  Nose: Nose normal.  Mouth/Throat: Oropharynx is clear and moist. No oropharyngeal exudate.  Eyes: Conjunctivae and EOM are normal. Pupils are equal, round, and reactive to light.  Neck: Normal range of motion. Neck supple.  Cardiovascular: Normal rate, regular rhythm, normal heart sounds and intact distal pulses.   Pulmonary/Chest: Effort normal and breath sounds normal. No respiratory distress. She has no wheezes. She exhibits no tenderness.  Abdominal: Soft. Bowel sounds are normal. She exhibits no distension. There is no tenderness. There is no rebound and no guarding.  No CVA tenderness  Musculoskeletal: She exhibits no  edema.  Neurological: She is alert and oriented to person, place, and time. No cranial nerve deficit.  Skin: Skin is warm and dry.  Psychiatric: She has a normal mood and affect.  Nursing note and vitals reviewed.   ED Course  Procedures (including critical care time) Labs Review Labs Reviewed  COMPREHENSIVE METABOLIC PANEL - Abnormal; Notable for the following:    Glucose, Bld 107 (*)    Calcium 8.7 (*)    All other components within normal limits  CBC - Abnormal; Notable for the following:    Hemoglobin 11.9 (*)    All other components within normal limits  URINALYSIS, ROUTINE W REFLEX MICROSCOPIC (NOT AT [facility]) - Abnormal; Notable for the  following:    Glucose, UA 100 (*)    All other components within normal limits  LIPASE, BLOOD  I-STAT BETA HCG BLOOD, ED ([facility], [facility], [facility] ONLY)    Imaging Review Dg Abd 1 View  08/24/2015  CLINICAL DATA:  Nausea vomiting.  Abdominal pain . EXAM: ABDOMEN - 1 VIEW COMPARISON:  CT 06/08/2013 . FINDINGS: Soft tissue structures are unremarkable. No bowel distention. Stool noted throughout colon. Pelvic calcifications consistent with phleboliths. No acute bony abnormality. IMPRESSION: No acute intra-abdominal abnormality. Electronically Signed   By: Maisie Fushomas  Register   On: 08/24/2015 07:17   I have personally reviewed and evaluated these images and lab results as part of my medical decision-making.   EKG Interpretation None      MDM   Final diagnoses:  Lower abdominal pain  Non-intractable vomiting with nausea, vomiting of unspecified type    Pt nontoxic appearing with no significant findings on exam. VS unremarkable and stable. Labs unremarkable. Acute abdominal xray was ordered and is negative. Pt tolerating PO. Feeling much improvement in her nausea and pain. Suspect viral etiology. Rx given for anti-emetics. Resource guide given for PCP f/u. ER return precautions given.    Carlene CoriaSerena Y Jamilet Ambroise, PA-C 08/24/15 1408  April Palumbo, MD 08/25/15 0005

## 2015-08-24 NOTE — ED Notes (Signed)
Discharge instructions, follow up care, and rx x2 reviewed with patient. Patient verbalized understanding. 

## 2015-08-24 NOTE — ED Notes (Signed)
Bed: WA07 Expected date:  Expected time:  Means of arrival:  Comments: 

## 2016-03-12 ENCOUNTER — Emergency Department (HOSPITAL_COMMUNITY)
Admission: EM | Admit: 2016-03-12 | Discharge: 2016-03-12 | Disposition: A | Discharge status: Home / Self Care | Payer: Self-pay | Attending: Emergency Medicine | Admitting: Emergency Medicine

## 2016-03-12 ENCOUNTER — Encounter (HOSPITAL_COMMUNITY): Payer: Self-pay | Admitting: Emergency Medicine

## 2016-03-12 DIAGNOSIS — Z79899 Other long term (current) drug therapy: Secondary | ICD-10-CM | POA: Insufficient documentation

## 2016-03-12 DIAGNOSIS — J45909 Unspecified asthma, uncomplicated: Secondary | ICD-10-CM | POA: Insufficient documentation

## 2016-03-12 DIAGNOSIS — S0990XA Unspecified injury of head, initial encounter: Secondary | ICD-10-CM | POA: Insufficient documentation

## 2016-03-12 DIAGNOSIS — Y999 Unspecified external cause status: Secondary | ICD-10-CM | POA: Insufficient documentation

## 2016-03-12 DIAGNOSIS — W228XXA Striking against or struck by other objects, initial encounter: Secondary | ICD-10-CM | POA: Insufficient documentation

## 2016-03-12 DIAGNOSIS — Z87891 Personal history of nicotine dependence: Secondary | ICD-10-CM | POA: Insufficient documentation

## 2016-03-12 DIAGNOSIS — Y929 Unspecified place or not applicable: Secondary | ICD-10-CM | POA: Insufficient documentation

## 2016-03-12 DIAGNOSIS — Y939 Activity, unspecified: Secondary | ICD-10-CM | POA: Insufficient documentation

## 2016-03-12 NOTE — ED Provider Notes (Signed)
[facility]-EMERGENCY DEPT Provider Note   CSN: [id number] Arrival date & time: 03/12/16  1044  By signing my name below, I, Placido SouLogan Joldersma, attest that this documentation has been prepared under the direction and in the presence of Ruthy Forry C. Marcena Dias, PA-C. Electronically Signed: Placido SouLogan Joldersma, ED Scribe. 03/12/16. 11:27 AM.   History   Chief Complaint Chief Complaint  Patient presents with  . Fall  . Head Injury    HPI HPI Comments: Susan Faulkner is a 35 y.o. female who presents to the Emergency Department complaining of a mechanical fall that occurred this morning. She states she was leaning out of her car door, leaned too far, lost her balance, and fell out of the car to the ground striking her right parietal scalp on a concrete surface. Pt states she went to work and "didn't think much of it, but then felt a small amount of bleeding from the region with mild swelling. Her bleeding has since been controlled. She also reports associated mild pain across the region.  Patient denies LOC, nausea/vomiting, neck/back pain, or any other complaints.    The history is provided by the patient and medical records. No language interpreter was used.   Past Medical History:  Diagnosis Date  . Asthma   . Ectopic pregnancy     There are no active problems to display for this patient.   Past Surgical History:  Procedure Laterality Date  . SALPINGECTOMY      OB History    No data available       Home Medications    Prior to Admission medications   Medication Sig Start Date End Date Taking? Authorizing Provider  albuterol (PROVENTIL HFA;VENTOLIN HFA) 108 (90 BASE) MCG/ACT inhaler Inhale 2 puffs into the lungs every 4 (four) hours as needed for wheezing or shortness of breath.    Historical Provider, MD  dicyclomine (BENTYL) 20 MG tablet Take 1 tablet (20 mg total) by mouth 2 (two) times daily. 08/24/15   Ace GinsSerena Y Sam, PA-C  ibuprofen (ADVIL,MOTRIN) 200 MG tablet Take 800 mg by mouth every  6 (six) hours as needed for fever, headache, mild pain, moderate pain or cramping.     Historical Provider, MD  miconazole (MONISTAT 7) 2 % vaginal cream Place 1 Applicatorful vaginally at bedtime.    Historical Provider, MD  ondansetron (ZOFRAN ODT) 4 MG disintegrating tablet Take 1 tablet (4 mg total) by mouth every 8 (eight) hours as needed for nausea or vomiting. 08/24/15   Carlene CoriaSerena Y Sam, PA-C    Family History No family history on file.  Social History Social History  Substance Use Topics  . Smoking status: Former Games developermoker  . Smokeless tobacco: Never Used  . Alcohol use No     Comment: occ     Allergies   Shellfish allergy and Iodine   Review of Systems Review of Systems  HENT: Negative for ear discharge.   Musculoskeletal: Negative for back pain and neck pain.  Skin: Positive for color change and wound.  Neurological: Negative for dizziness, syncope, weakness, light-headedness, numbness and headaches.   Physical Exam Updated Vital Signs BP 114/78 (BP Location: Left Arm)   Pulse 90   Temp 98.4 F (36.9 C) (Oral)   Resp 16   SpO2 98%   Physical Exam  Constitutional: She appears well-developed and well-nourished. No distress.  HENT:  Head: Normocephalic.  Small area of tenderness and swelling to the right parietal region w/o noted instability, crepitus or deformity. Evidence of a  small superficial laceration or abrasion with evidence of previous hemorrhage. No active hemorrhage.   Eyes: Conjunctivae are normal.  Neck: Neck supple.  Cardiovascular: Normal rate and regular rhythm.   Pulmonary/Chest: Effort normal.  Musculoskeletal:  Normal motor function intact in all extremities and spine. No midline spinal tenderness.    Neurological: She is alert.  No sensory deficits. Strength 5/5 in all extremities. No gait disturbance. Coordination intact. Cranial nerves III-XII grossly intact. No facial droop.    Skin: Skin is warm and dry. She is not diaphoretic.  Psychiatric:  She has a normal mood and affect. Her behavior is normal.  Nursing note and vitals reviewed.  ED Treatments / Results  Labs (all labs ordered are listed, but only abnormal results are displayed) Labs Reviewed - No data to display  EKG  EKG Interpretation None       Radiology No results found.  Procedures Procedures  DIAGNOSTIC STUDIES: Oxygen Saturation is 98% on RA, normal by my interpretation.    COORDINATION OF CARE: 11:25 AM Discussed next steps with pt. Pt verbalized understanding and is agreeable with the plan.    Medications Ordered in ED Medications - No data to display   Initial Impression / Assessment and Plan / ED Course  I have reviewed the triage vital signs and the nursing notes.  Pertinent labs & imaging results that were available during my care of the patient were reviewed by me and considered in my medical decision making (see chart for details).  Clinical Course     Patient presents with a scalp injury following a fall. No active hemorrhage. No signs of serious head injury. Home care and return precautions discussed.   I personally performed the services described in this documentation, which was scribed in my presence. The recorded information has been reviewed and is accurate.  Final Clinical Impressions(s) / ED Diagnoses   Final diagnoses:  Injury of head, initial encounter    New Prescriptions Discharge Medication List as of 03/12/2016 11:34 AM       Anselm PancoastShawn C Russell Engelstad, PA-C 03/13/16 1932    Lorre NickAnthony Allen, MD 03/13/16 [phone]

## 2016-03-12 NOTE — ED Triage Notes (Signed)
Patient states that she fell getting out of car this morning and fell. patient hit her head on ground. Patient  States that she noticed later on that she was bleeding on right side of head.  Patient denies LOC.

## 2016-03-12 NOTE — Discharge Instructions (Signed)
You presented today with a head injury. It does not appear to be serious at this time. Refer to the attached literature for expected symptoms and symptoms that would require you to return to the ED. Use Tylenol, ibuprofen, naproxen for pain. Follow up with a primary care provider as needed should symptoms continue. Return to the ED should symptoms worsen.

## 2016-04-24 ENCOUNTER — Encounter (HOSPITAL_COMMUNITY): Payer: Self-pay | Admitting: Emergency Medicine

## 2016-04-24 ENCOUNTER — Emergency Department (HOSPITAL_COMMUNITY)
Admission: EM | Admit: 2016-04-24 | Discharge: 2016-04-24 | Disposition: A | Discharge status: Home / Self Care | Payer: Self-pay | Attending: Emergency Medicine | Admitting: Emergency Medicine

## 2016-04-24 DIAGNOSIS — Z87891 Personal history of nicotine dependence: Secondary | ICD-10-CM | POA: Insufficient documentation

## 2016-04-24 DIAGNOSIS — M436 Torticollis: Secondary | ICD-10-CM | POA: Insufficient documentation

## 2016-04-24 DIAGNOSIS — J45909 Unspecified asthma, uncomplicated: Secondary | ICD-10-CM | POA: Insufficient documentation

## 2016-04-24 MED ORDER — OXYCODONE-ACETAMINOPHEN 5-325 MG PO TABS
1.0000 | ORAL_TABLET | Freq: Once | ORAL | Status: AC
  Administered 2016-04-24: 1 via ORAL
  Filled 2016-04-24: qty 1

## 2016-04-24 MED ORDER — IBUPROFEN 800 MG PO TABS
800.0000 mg | ORAL_TABLET | Freq: Once | ORAL | Status: AC
  Administered 2016-04-24: 800 mg via ORAL
  Filled 2016-04-24: qty 1

## 2016-04-24 MED ORDER — CYCLOBENZAPRINE HCL 10 MG PO TABS
10.0000 mg | ORAL_TABLET | Freq: Once | ORAL | Status: AC
  Administered 2016-04-24: 10 mg via ORAL
  Filled 2016-04-24: qty 1

## 2016-04-24 MED ORDER — CYCLOBENZAPRINE HCL 10 MG PO TABS: 10.0000 mg | ORAL_TABLET | Freq: Two times a day (BID) | ORAL | 0 refills | Status: DC | PRN

## 2016-04-24 MED ORDER — OXYCODONE-ACETAMINOPHEN 5-325 MG PO TABS: 1.0000 | ORAL_TABLET | ORAL | 0 refills | Status: DC | PRN

## 2016-04-24 MED ORDER — IBUPROFEN 600 MG PO TABS: 600.0000 mg | ORAL_TABLET | Freq: Four times a day (QID) | ORAL | 0 refills | Status: DC | PRN

## 2016-04-24 NOTE — ED Triage Notes (Addendum)
Patient noticed neck pain 3 days ago that felt like she slept on it wrong. Pain has gotten increasingly worse, pain woke her from sleep this morning.  Pt states she cannot turn her head to either side without pain, rt side worse than left.

## 2016-04-24 NOTE — ED Notes (Signed)
Bed: WTR5 Expected date:  Expected time:  Means of arrival:  Comments: 

## 2016-04-30 NOTE — ED Provider Notes (Signed)
[facility]-EMERGENCY DEPT Provider Note   CSN: [id number] Arrival date & time: 04/24/16  0056     History   Chief Complaint Chief Complaint  Patient presents with  . Neck Pain    HPI Susan Faulkner is a 36 y.o. female.  The patient presents with severe right sided neck pain that woke her from sleep this evening. She has had soreness for the past 3 days but tonight it became severe involving the right lateral neck. No known injury.  She does not feel she can turn her head and there is no comfortable position. No difficulty swallowing, sore throat, midline cervical pain or difficulty breathing.    The history is provided by the patient. No language interpreter was used.    Past Medical History:  Diagnosis Date  . Asthma   . Ectopic pregnancy     There are no active problems to display for this patient.   Past Surgical History:  Procedure Laterality Date  . SALPINGECTOMY      OB History    No data available       Home Medications    Prior to Admission medications   Medication Sig Start Date End Date Taking? Authorizing Provider  albuterol (PROVENTIL HFA;VENTOLIN HFA) 108 (90 BASE) MCG/ACT inhaler Inhale 2 puffs into the lungs every 4 (four) hours as needed for wheezing or shortness of breath.    Historical Provider, MD  cyclobenzaprine (FLEXERIL) 10 MG tablet Take 1 tablet (10 mg total) by mouth 2 (two) times daily as needed for muscle spasms. 04/24/16   Elpidio Anis, PA-C  dicyclomine (BENTYL) 20 MG tablet Take 1 tablet (20 mg total) by mouth 2 (two) times daily. 08/24/15   Ace Gins Sam, PA-C  ibuprofen (ADVIL,MOTRIN) 600 MG tablet Take 1 tablet (600 mg total) by mouth every 6 (six) hours as needed. 04/24/16   Elpidio Anis, PA-C  miconazole (MONISTAT 7) 2 % vaginal cream Place 1 Applicatorful vaginally at bedtime.    Historical Provider, MD  ondansetron (ZOFRAN ODT) 4 MG disintegrating tablet Take 1 tablet (4 mg total) by mouth every 8 (eight) hours as needed for nausea  or vomiting. 08/24/15   Ace Gins Sam, PA-C  oxyCODONE-acetaminophen (PERCOCET/ROXICET) 5-325 MG tablet Take 1 tablet by mouth every 4 (four) hours as needed for severe pain. 04/24/16   Elpidio Anis, PA-C    Family History History reviewed. No pertinent family history.  Social History Social History  Substance Use Topics  . Smoking status: Former Games developer  . Smokeless tobacco: Never Used  . Alcohol use No     Comment: occ     Allergies   Shellfish allergy and Iodine   Review of Systems Review of Systems  Constitutional: Negative for chills and fever.  HENT: Negative.  Negative for facial swelling, sore throat and trouble swallowing.   Respiratory: Negative.  Negative for shortness of breath.   Gastrointestinal: Negative for nausea.  Musculoskeletal: Positive for neck pain (See HPI.).  Neurological: Negative.      Physical Exam Updated Vital Signs BP 142/92 (BP Location: Left Arm)   Pulse 96   Temp 97.5 F (36.4 C) (Oral)   Resp 18   LMP 04/06/2016 Comment: states last period lasted over 2 weeks  SpO2 99%   Physical Exam  Constitutional: She is oriented to person, place, and time. She appears well-developed and well-nourished.  Neck: Normal range of motion.  Pulmonary/Chest: Effort normal.  Musculoskeletal: She exhibits no edema.  No midline cervical tenderness. Right  SCM muscle tense and extremely tender. FROM UE's. No redness or warmth.  Neurological: She is alert and oriented to person, place, and time.  Skin: Skin is warm and dry.     ED Treatments / Results  Labs (all labs ordered are listed, but only abnormal results are displayed) Labs Reviewed - No data to display  EKG  EKG Interpretation None       Radiology No results found.  Procedures Procedures (including critical care time)  Medications Ordered in ED Medications  cyclobenzaprine (FLEXERIL) tablet 10 mg (10 mg Oral Given 04/24/16 0246)  oxyCODONE-acetaminophen (PERCOCET/ROXICET) 5-325  MG per tablet 1 tablet (1 tablet Oral Given 04/24/16 0246)  ibuprofen (ADVIL,MOTRIN) tablet 800 mg (800 mg Oral Given 04/24/16 0246)     Initial Impression / Assessment and Plan / ED Course  I have reviewed the triage vital signs and the nursing notes.  Pertinent labs & imaging results that were available during my care of the patient were reviewed by me and considered in my medical decision making (see chart for details).     Patient with right lateral neck pain c/w torticollis.   Final Clinical Impressions(s) / ED Diagnoses   Final diagnoses:  Torticollis, acute    New Prescriptions Discharge Medication List as of 04/24/2016  2:43 AM    START taking these medications   Details  cyclobenzaprine (FLEXERIL) 10 MG tablet Take 1 tablet (10 mg total) by mouth 2 (two) times daily as needed for muscle spasms., Starting Tue 04/24/2016, Print    oxyCODONE-acetaminophen (PERCOCET/ROXICET) 5-325 MG tablet Take 1 tablet by mouth every 4 (four) hours as needed for severe pain., Starting Tue 04/24/2016, Print         Elpidio AnisShari Gaelyn Tukes, PA-C 04/30/16 2032    Gilda Creasehristopher J Pollina, MD 05/06/16 [phone]

## 2016-10-24 ENCOUNTER — Emergency Department (HOSPITAL_COMMUNITY): Payer: BLUE CROSS/BLUE SHIELD

## 2016-10-24 ENCOUNTER — Encounter (HOSPITAL_COMMUNITY): Payer: Self-pay | Admitting: Emergency Medicine

## 2016-10-24 ENCOUNTER — Emergency Department (HOSPITAL_COMMUNITY)
Admission: EM | Admit: 2016-10-24 | Discharge: 2016-10-24 | Disposition: A | Discharge status: Home / Self Care | Payer: BLUE CROSS/BLUE SHIELD | Attending: Emergency Medicine | Admitting: Emergency Medicine

## 2016-10-24 DIAGNOSIS — Z87891 Personal history of nicotine dependence: Secondary | ICD-10-CM | POA: Diagnosis not present

## 2016-10-24 DIAGNOSIS — M25561 Pain in right knee: Secondary | ICD-10-CM | POA: Insufficient documentation

## 2016-10-24 MED ORDER — IBUPROFEN 800 MG PO TABS
800.0000 mg | ORAL_TABLET | Freq: Once | ORAL | Status: AC
  Administered 2016-10-24: 800 mg via ORAL
  Filled 2016-10-24: qty 1

## 2016-10-24 MED ORDER — IBUPROFEN 800 MG PO TABS: 800.0000 mg | ORAL_TABLET | Freq: Three times a day (TID) | ORAL | 0 refills | Status: DC | PRN

## 2016-10-24 NOTE — ED Notes (Signed)
Discharge instructions reviewed with patient. Patient verbalizes understanding. VSS.   

## 2016-10-24 NOTE — Progress Notes (Signed)
Orthopedic Tech Progress Note Patient Details:  [name] Dec 17, 1980 [id number]  Ortho Devices Type of Ortho Device: Crutches Ortho Device/Splint Location: Crutches for Rt Knee Ortho Device/Splint Interventions: Adjustment   Susan Dupesvery S [name] 10/24/2016, 8:21 PM

## 2016-10-24 NOTE — ED Provider Notes (Signed)
[facility]-EMERGENCY DEPT Provider Note   CSN: [id number] Arrival date & time: 10/24/16  [id number]     History   Chief Complaint Chief Complaint  Patient presents with  . Knee Pain    HPI Susan Faulkner is a 36 y.o. female.  The history is provided by the patient and medical records. No language interpreter was used.   Susan Faulkner is a 36 y.o. female  with a PMH of asthma who presents to the Emergency Department complaining of acute onset right knee pain which began today. She stood up from her desk at work and felt a sharp pain and knee has been hurting since. No known injury or trauma. No increase in activity. Associated with swelling. Pain worse with palpation, ambulation and range of motion. Patient does have hx of ACL tear in this knee approximately 10 years ago. She will intermittently experience pain, but never of this severity. She typically does not have associated swelling either. No numbness or tingling.  No medications taken prior to arrival for symptoms.  Past Medical History:  Diagnosis Date  . Asthma   . Ectopic pregnancy     There are no active problems to display for this patient.   Past Surgical History:  Procedure Laterality Date  . SALPINGECTOMY      OB History    No data available       Home Medications    Prior to Admission medications   Medication Sig Start Date End Date Taking? Authorizing Provider  albuterol (PROVENTIL HFA;VENTOLIN HFA) 108 (90 BASE) MCG/ACT inhaler Inhale 2 puffs into the lungs every 4 (four) hours as needed for wheezing or shortness of breath.    [provider]  cyclobenzaprine (FLEXERIL) 10 MG tablet Take 1 tablet (10 mg total) by mouth 2 (two) times daily as needed for muscle spasms. 04/24/16   Elpidio AnisUpstill, Shari, PA-C  dicyclomine (BENTYL) 20 MG tablet Take 1 tablet (20 mg total) by mouth 2 (two) times daily. 08/24/15   Sam, Ace GinsSerena Y, PA-C  ibuprofen (ADVIL,MOTRIN) 600 MG tablet Take 1 tablet (600 mg total) by mouth every  6 (six) hours as needed. 04/24/16   Elpidio AnisUpstill, Shari, PA-C  miconazole (MONISTAT 7) 2 % vaginal cream Place 1 Applicatorful vaginally at bedtime.    [provider]  ondansetron (ZOFRAN ODT) 4 MG disintegrating tablet Take 1 tablet (4 mg total) by mouth every 8 (eight) hours as needed for nausea or vomiting. 08/24/15   Sam, Ace GinsSerena Y, PA-C  oxyCODONE-acetaminophen (PERCOCET/ROXICET) 5-325 MG tablet Take 1 tablet by mouth every 4 (four) hours as needed for severe pain. 04/24/16   Elpidio AnisUpstill, Shari, PA-C    Family History History reviewed. No pertinent family history.  Social History Social History  Substance Use Topics  . Smoking status: Former Games developermoker  . Smokeless tobacco: Never Used  . Alcohol use No     Comment: occ     Allergies   Shellfish allergy and Iodine   Review of Systems Review of Systems  Musculoskeletal: Positive for arthralgias. Negative for back pain.  Skin: Negative for color change and wound.  Neurological: Negative for weakness and numbness.     Physical Exam Updated Vital Signs BP 112/68 (BP Location: Right Arm)   Pulse 92   Temp 98.2 F (36.8 C) (Oral)   Resp 16   LMP 09/20/2016   SpO2 100%   Physical Exam  Constitutional: She appears well-developed and well-nourished. No distress.  HENT:  Head: Normocephalic and atraumatic.  Neck:  Neck supple.  Cardiovascular: Normal rate, regular rhythm and normal heart sounds.   No murmur heard. Pulmonary/Chest: Effort normal and breath sounds normal. No respiratory distress. She has no wheezes. She has no rales.  Musculoskeletal:  Tenderness to palpation of right anterolateral knee. + joint line tenderness. Full ROM although pain with flexion/extension. Mild swelling. No abnormal alignment or patellar mobility. No bruising, erythema or warmth overlaying the joint. No varus/valgus laxity. Negative drawer's, Lachman's. Pain with McMurray's.  Crepitus over patella with flexion/extension. 2+ DP pulses bilaterally.  All compartments are soft. Sensation intact distal to injury.  Neurological: She is alert.  Skin: Skin is warm and dry.  Nursing note and vitals reviewed.    ED Treatments / Results  Labs (all labs ordered are listed, but only abnormal results are displayed) Labs Reviewed - No data to display  EKG  EKG Interpretation None       Radiology Dg Knee Complete 4 Views Right  Result Date: 10/24/2016 CLINICAL DATA:  Right anterior knee pain and swelling. Previous ACL tear. No recent injuries. EXAM: RIGHT KNEE - COMPLETE 4+ VIEW COMPARISON:  02/06/2014. FINDINGS: Mild tricompartmental spur formation.  No effusion. IMPRESSION: Stable mild tricompartmental degenerative changes. Electronically Signed   By: Beckie SaltsSteven  Reid M.D.   On: 10/24/2016 19:28    Procedures Procedures (including critical care time)  Medications Ordered in ED Medications  ibuprofen (ADVIL,MOTRIN) tablet 800 mg (800 mg Oral Given 10/24/16 1910)     Initial Impression / Assessment and Plan / ED Course  I have reviewed the triage vital signs and the nursing notes.  Pertinent labs & imaging results that were available during my care of the patient were reviewed by me and considered in my medical decision making (see chart for details).    Susan Faulkner is a 36 y.o. female who presents to ED for acute onset of right knee pain which began today. Hx of ACL repair 10+ years ago. No longer followed by ortho and has been doing well. On exam, patient with lateral joint line tenderness and pain with McMurray's. Concern for meniscal injury. NVI. Ambulatory but with limp and appears painful. X-ray negative. Crutches provided. RICE+ NSAIDS discussed. Call ortho tomorrow am to arrange follow up for 7-10 days. Return precautions discussed. All questions answered.    Final Clinical Impressions(s) / ED Diagnoses   Final diagnoses:  Acute pain of right knee    New Prescriptions New Prescriptions   No medications on file       Ward, Chase PicketJaime Pilcher, Cordelia Poche-C 10/24/16 Hershal Coria1935    Haviland, Julie, MD 10/24/16 [id number]

## 2016-10-24 NOTE — Discharge Instructions (Signed)
It was my pleasure taking care of you today!   Ibuprofen as needed for pain. Use crutches as needed for comfort. Ice and elevate knee throughout the day.  Call the orthopedist listed tomorrow to schedule follow up appointment for recheck of ongoing knee pain in one to two weeks. That appointment can be canceled with a 24-48 hour notice if complete resolution of pain.  Return to the ER for new or worsening symptoms, any additional concerns.

## 2016-10-24 NOTE — ED Triage Notes (Addendum)
Pt c/o right anterior lateral knee pain and edema onset today.  No trauma. Hx ACL tear 10 years ago, experiences intermittent pain since then but current pain is more severe and edema is new. Well localized pain and tenderness to right anterior lateral knee worse with weight bearing, palpation, and ROM. 2+ pedal pulse. Sensation and motor function intact.

## 2016-11-02 ENCOUNTER — Ambulatory Visit (INDEPENDENT_AMBULATORY_CARE_PROVIDER_SITE_OTHER): Payer: BLUE CROSS/BLUE SHIELD | Admitting: Obstetrics

## 2016-11-02 ENCOUNTER — Encounter: Payer: Self-pay | Admitting: Obstetrics

## 2016-11-02 VITALS — BP 129/82 | HR 76 | Ht 65.0 in | Wt 303.0 lb

## 2016-11-02 DIAGNOSIS — Z01419 Encounter for gynecological examination (general) (routine) without abnormal findings: Secondary | ICD-10-CM | POA: Diagnosis not present

## 2016-11-02 DIAGNOSIS — Z1151 Encounter for screening for human papillomavirus (HPV): Secondary | ICD-10-CM

## 2016-11-02 DIAGNOSIS — B373 Candidiasis of vulva and vagina: Secondary | ICD-10-CM

## 2016-11-02 DIAGNOSIS — Z113 Encounter for screening for infections with a predominantly sexual mode of transmission: Secondary | ICD-10-CM

## 2016-11-02 DIAGNOSIS — R6882 Decreased libido: Secondary | ICD-10-CM

## 2016-11-02 DIAGNOSIS — B3731 Acute candidiasis of vulva and vagina: Secondary | ICD-10-CM

## 2016-11-02 DIAGNOSIS — Z124 Encounter for screening for malignant neoplasm of cervix: Secondary | ICD-10-CM

## 2016-11-02 DIAGNOSIS — B9689 Other specified bacterial agents as the cause of diseases classified elsewhere: Secondary | ICD-10-CM

## 2016-11-02 DIAGNOSIS — N760 Acute vaginitis: Secondary | ICD-10-CM

## 2016-11-02 MED ORDER — FLUCONAZOLE 150 MG PO TABS: 150.0000 mg | ORAL_TABLET | Freq: Once | ORAL | 2 refills | Status: AC

## 2016-11-02 MED ORDER — METRONIDAZOLE 500 MG PO TABS: 500.0000 mg | ORAL_TABLET | Freq: Two times a day (BID) | ORAL | 2 refills | Status: DC

## 2016-11-02 NOTE — Progress Notes (Signed)
Subjective:        Susan EstersLetisha C Faulkner is a 36 y.o. female here for a routine exam.  Current complaints: Fishy vaginal discharge and itching.  Has also felt a decrease in libido.  Personal health questionnaire:  Is patient [name], have a family history of breast and/or ovarian cancer: no Is there a family history of uterine cancer diagnosed at age < 1050, gastrointestinal cancer, urinary tract cancer, family member who is a Personnel officerLynch syndrome-associated carrier: no Is the patient overweight and hypertensive, family history of diabetes, personal history of gestational diabetes, preeclampsia or PCOS: no Is patient over 7155, have PCOS,  family history of premature CHD under age 34, diabetes, smoke, have hypertension or peripheral artery disease:  no At any time, has a partner hit, kicked or otherwise hurt or frightened you?: no Over the past 2 weeks, have you felt down, depressed or hopeless?: no Over the past 2 weeks, have you felt little interest or pleasure in doing things?:no   Gynecologic History Patient's last menstrual period was 10/28/2016. Contraception: Bilateral Salpingectomy Last Pap: several years. Results were: normal Last mammogram: n/a. Results were: n/a  Obstetric History OB History  Gravida Para Term Preterm AB Living  2       2 0  SAB TAB Ectopic Multiple Live Births      2        # Outcome Date GA Lbr Len/2nd Weight Sex Delivery Anes PTL Lv  2 Ectopic 11/2011          1 Ectopic 04/2011              Past Medical History:  Diagnosis Date  . Asthma   . Ectopic pregnancy 2013   2    Past Surgical History:  Procedure Laterality Date  . SALPINGECTOMY       Current Outpatient Prescriptions:  .  albuterol (PROVENTIL HFA;VENTOLIN HFA) 108 (90 BASE) MCG/ACT inhaler, Inhale 2 puffs into the lungs every 4 (four) hours as needed for wheezing or shortness of breath., Disp: , Rfl:  .  ibuprofen (ADVIL,MOTRIN) 800 MG tablet, Take 1 tablet (800 mg total) by mouth  every 8 (eight) hours as needed., Disp: 21 tablet, Rfl: 0 .  cyclobenzaprine (FLEXERIL) 10 MG tablet, Take 1 tablet (10 mg total) by mouth 2 (two) times daily as needed for muscle spasms. (Patient not taking: Reported on 11/02/2016), Disp: 20 tablet, Rfl: 0 .  dicyclomine (BENTYL) 20 MG tablet, Take 1 tablet (20 mg total) by mouth 2 (two) times daily. (Patient not taking: Reported on 11/02/2016), Disp: 20 tablet, Rfl: 0 .  fluconazole (DIFLUCAN) 150 MG tablet, Take 1 tablet (150 mg total) by mouth once., Disp: 1 tablet, Rfl: 2 .  metroNIDAZOLE (FLAGYL) 500 MG tablet, Take 1 tablet (500 mg total) by mouth 2 (two) times daily., Disp: 14 tablet, Rfl: 2 .  miconazole (MONISTAT 7) 2 % vaginal cream, Place 1 Applicatorful vaginally at bedtime., Disp: , Rfl:  .  ondansetron (ZOFRAN ODT) 4 MG disintegrating tablet, Take 1 tablet (4 mg total) by mouth every 8 (eight) hours as needed for nausea or vomiting. (Patient not taking: Reported on 11/02/2016), Disp: 20 tablet, Rfl: 0 .  oxyCODONE-acetaminophen (PERCOCET/ROXICET) 5-325 MG tablet, Take 1 tablet by mouth every 4 (four) hours as needed for severe pain. (Patient not taking: Reported on 11/02/2016), Disp: 6 tablet, Rfl: 0 Allergies  Allergen Reactions  . Shellfish Allergy Anaphylaxis and Swelling  . Iodine Other (See Comments)  No known reaction - was told to say she's allergic due to the reaction with shellfish     Social History  Substance Use Topics  . Smoking status: Former Games developer  . Smokeless tobacco: Never Used  . Alcohol use No     Comment: occ    History reviewed. No pertinent family history.    Review of Systems  Constitutional: negative for fatigue and weight loss Respiratory: negative for cough and wheezing Cardiovascular: negative for chest pain, fatigue and palpitations Gastrointestinal: negative for abdominal pain and change in bowel habits Musculoskeletal:negative for myalgias Neurological: negative for gait problems and  tremors Behavioral/Psych: negative for abusive relationship, depression Endocrine: negative for temperature intolerance    Genitourinary:positive for malodorous vaginal discharge and itching Integument/breast: negative for breast lump, breast tenderness, nipple discharge and skin lesion(s)    Objective:       BP 129/82   Pulse 76   Ht 5\' 5"  (1.651 m)   Wt (!) 303 lb (137.4 kg)   LMP 10/28/2016   BMI 50.42 kg/m  General:   alert  Skin:   no rash or abnormalities  Lungs:   clear to auscultation bilaterally  Heart:   regular rate and rhythm, S1, S2 normal, no murmur, click, rub or gallop  Breasts:   normal without suspicious masses, skin or nipple changes or axillary nodes  Abdomen:  normal findings: no organomegaly, soft, non-tender and no hernia  Pelvis:  External genitalia: normal general appearance Urinary system: urethral meatus normal and bladder without fullness, nontender Vaginal: normal without tenderness, induration or masses Cervix: normal appearance Adnexa: normal bimanual exam Uterus: anteverted and non-tender, normal size   Lab Review Urine pregnancy test Labs reviewed yes Radiologic studies reviewed no  50% of 20 min visit spent on counseling and coordination of care.    Assessment:     1. Encounter for routine gynecological examination with Papanicolaou smear of cervix Rx: - Cytology - PAP - Cervicovaginal ancillary only  2. BV (bacterial vaginosis) Rx: - metroNIDAZOLE (FLAGYL) 500 MG tablet; Take 1 tablet (500 mg total) by mouth 2 (two) times daily.  Dispense: 14 tablet; Refill: 2  3. Candida vaginitis Rx: - fluconazole (DIFLUCAN) 150 MG tablet; Take 1 tablet (150 mg total) by mouth once.  Dispense: 1 tablet; Refill: 2  4. Decreased libido without sexual dysfunction - discussed some of the treatments for decreased libido  5. Morbid obesity - recommended weight loss program that includes caloric reduction, exercise and a maintenance program     Plan:    Education reviewed: calcium supplements, depression evaluation, low fat, low cholesterol diet, safe sex/STD prevention, self breast exams and weight bearing exercise. Contraception: Salpingectomies. Follow up in: 1 year.   Meds ordered this encounter  Medications  . metroNIDAZOLE (FLAGYL) 500 MG tablet    Sig: Take 1 tablet (500 mg total) by mouth 2 (two) times daily.    Dispense:  14 tablet    Refill:  2  . fluconazole (DIFLUCAN) 150 MG tablet    Sig: Take 1 tablet (150 mg total) by mouth once.    Dispense:  1 tablet    Refill:  2   No orders of the defined types were placed in this encounter.     Patient ID: Susan Faulkner, female   DOB: May 11, 1980, 36 y.o.   MRN: [id number]   Patient ID: Susan Faulkner, female   DOB: [birth year], 36 y.o.   MRN: [id number]

## 2016-11-02 NOTE — Patient Instructions (Addendum)
Bacterial Vaginosis Bacterial vaginosis is a vaginal infection that occurs when the normal balance of bacteria in the vagina is disrupted. It results from an overgrowth of certain bacteria. This is the most common vaginal infection among women ages 15-44. Because bacterial vaginosis increases your risk for STIs (sexually transmitted infections), getting treated can help reduce your risk for chlamydia, gonorrhea, herpes, and HIV (human immunodeficiency virus). Treatment is also important for preventing complications in pregnant women, because this condition can cause an early (premature) delivery. What are the causes? This condition is caused by an increase in harmful bacteria that are normally present in small amounts in the vagina. However, the reason that the condition develops is not fully understood. What increases the risk? The following factors may make you more likely to develop this condition:  Having a new sexual partner or multiple sexual partners.  Having unprotected sex.  Douching.  Having an intrauterine device (IUD).  Smoking.  Drug and alcohol abuse.  Taking certain antibiotic medicines.  Being pregnant.  You cannot get bacterial vaginosis from toilet seats, bedding, swimming pools, or contact with objects around you. What are the signs or symptoms? Symptoms of this condition include:  Grey or white vaginal discharge. The discharge can also be watery or foamy.  A fish-like odor with discharge, especially after sexual intercourse or during menstruation.  Itching in and around the vagina.  Burning or pain with urination.  Some women with bacterial vaginosis have no signs or symptoms. How is this diagnosed? This condition is diagnosed based on:  Your medical history.  A physical exam of the vagina.  Testing a sample of vaginal fluid under a microscope to look for a large amount of bad bacteria or abnormal cells. Your health care provider may use a cotton swab  or a small wooden spatula to collect the sample.  How is this treated? This condition is treated with antibiotics. These may be given as a pill, a vaginal cream, or a medicine that is put into the vagina (suppository). If the condition comes back after treatment, a second round of antibiotics may be needed. Follow these instructions at home: Medicines  Take over-the-counter and prescription medicines only as told by your health care provider.  Take or use your antibiotic as told by your health care provider. Do not stop taking or using the antibiotic even if you start to feel better. General instructions  If you have a female sexual partner, tell her that you have a vaginal infection. She should see her health care provider and be treated if she has symptoms. If you have a female sexual partner, he does not need treatment.  During treatment: ? Avoid sexual activity until you finish treatment. ? Do not douche. ? Avoid alcohol as directed by your health care provider. ? Avoid breastfeeding as directed by your health care provider.  Drink enough water and fluids to keep your urine clear or pale yellow.  Keep the area around your vagina and rectum clean. ? Wash the area daily with warm water. ? Wipe yourself from front to back after using the toilet.  Keep all follow-up visits as told by your health care provider. This is important. How is this prevented?  Do not douche.  Wash the outside of your vagina with warm water only.  Use protection when having sex. This includes latex condoms and dental dams.  Limit how many sexual partners you have. To help prevent bacterial vaginosis, it is best to have sex with just   one partner (monogamous).  Make sure you and your sexual partner are tested for STIs.  Wear cotton or cotton-lined underwear.  Avoid wearing tight pants and pantyhose, especially during summer.  Limit the amount of alcohol that you drink.  Do not use any products that  contain nicotine or tobacco, such as cigarettes and e-cigarettes. If you need help quitting, ask your health care provider.  Do not use illegal drugs. Where to find more information:  Centers for Disease Control and Prevention: www.cdc.gov/std  American Sexual Health Association (ASHA): www.ashastd.org  [place] Department of Health and Human Services, Office on Women's Health: www.womenshealth.gov/ or https://www.womenshealth.gov/a-z-topics/bacterial-vaginosis Contact a health care provider if:  Your symptoms do not improve, even after treatment.  You have more discharge or pain when urinating.  You have a fever.  You have pain in your abdomen.  You have pain during sex.  You have vaginal bleeding between periods. Summary  Bacterial vaginosis is a vaginal infection that occurs when the normal balance of bacteria in the vagina is disrupted.  Because bacterial vaginosis increases your risk for STIs (sexually transmitted infections), getting treated can help reduce your risk for chlamydia, gonorrhea, herpes, and HIV (human immunodeficiency virus). Treatment is also important for preventing complications in pregnant women, because the condition can cause an early (premature) delivery.  This condition is treated with antibiotic medicines. These may be given as a pill, a vaginal cream, or a medicine that is put into the vagina (suppository). This information is not intended to replace advice given to you by your health care provider. Make sure you discuss any questions you have with your health care provider. Document Released: 03/12/2005 Document Revised: 11/26/2015 Document Reviewed: 11/26/2015 Elsevier Interactive Patient Education  2017 [employer].  

## 2016-11-02 NOTE — Progress Notes (Signed)
Patient is in the office for annual exam, pt states last pap was over a year ago. Pt complains of vaginal itching and odor.

## 2016-11-03 ENCOUNTER — Encounter (HOSPITAL_COMMUNITY): Payer: Self-pay | Admitting: Emergency Medicine

## 2016-11-03 ENCOUNTER — Emergency Department (HOSPITAL_COMMUNITY)
Admission: EM | Admit: 2016-11-03 | Discharge: 2016-11-03 | Disposition: A | Discharge status: Home / Self Care | Payer: BLUE CROSS/BLUE SHIELD | Attending: Emergency Medicine | Admitting: Emergency Medicine

## 2016-11-03 DIAGNOSIS — H5712 Ocular pain, left eye: Secondary | ICD-10-CM | POA: Diagnosis present

## 2016-11-03 DIAGNOSIS — Z87891 Personal history of nicotine dependence: Secondary | ICD-10-CM | POA: Insufficient documentation

## 2016-11-03 DIAGNOSIS — H109 Unspecified conjunctivitis: Secondary | ICD-10-CM

## 2016-11-03 MED ORDER — OXYCODONE-ACETAMINOPHEN 5-325 MG PO TABS
1.0000 | ORAL_TABLET | ORAL | Status: DC | PRN
  Administered 2016-11-03: 1 via ORAL
  Filled 2016-11-03: qty 1

## 2016-11-03 MED ORDER — POLYMYXIN B-TRIMETHOPRIM 10000-0.1 UNIT/ML-% OP SOLN: 2.0000 [drp] | Freq: Four times a day (QID) | OPHTHALMIC | 0 refills | Status: AC

## 2016-11-03 MED ORDER — TETRACAINE HCL 0.5 % OP SOLN
2.0000 [drp] | Freq: Once | OPHTHALMIC | Status: AC
  Administered 2016-11-03: 2 [drp] via OPHTHALMIC
  Filled 2016-11-03: qty 4

## 2016-11-03 MED ORDER — FLUORESCEIN SODIUM 0.6 MG OP STRP
1.0000 | ORAL_STRIP | Freq: Once | OPHTHALMIC | Status: AC
  Administered 2016-11-03: 1 via OPHTHALMIC

## 2016-11-03 NOTE — ED Notes (Signed)
Bed: WTR5 Expected date:  Expected time:  Means of arrival:  Comments: 

## 2016-11-03 NOTE — Discharge Instructions (Signed)
Use antibiotic drops as prescribed. You may use Tylenol or ibuprofen as needed for pain. Follow-up with the ophthalmologist for further evaluation of your eye. Return to the emergency department immediately if you have complete loss of vision. Return to the emergency department if you develop worsening pain, fever, or chills while on antibiotics more than 24 hours. Return if you have any new or concerning symptoms.

## 2016-11-03 NOTE — ED Triage Notes (Signed)
Pt reports a 6 day hx of pain in l/eye. C/o itching, burning and blurred vision. Pt stated that it feels like sand in her eye, crusting discharge on her eye each morning

## 2016-11-03 NOTE — ED Triage Notes (Signed)
Pt reports increased pain. Pt is not driving. Will medicate

## 2016-11-03 NOTE — ED Provider Notes (Signed)
[facility]-EMERGENCY DEPT Provider Note   CSN: [id number] Arrival date & time: 11/03/16  0930  By signing my name below, I, Susan Faulkner, attest that this documentation has been prepared under the direction and in the presence of Susan Pursell, PA-C. Electronically Signed: Diona Faulkner, ED Scribe. 11/03/16. 11:14 AM.  History   Chief Complaint Chief Complaint  Patient presents with  . Eye Problem    HPI Susan Faulkner is a 36 y.o. female with a PMHx of asthma who presents to the Emergency Department complaining of gradually worsening left eye redness for the last 6 days. Pt reports pain started ~ 3 days ago. Associated sx include L eye blurry vision, L eye discharge, and nasal congestion. Pt describes her discomfort as feeling like there is sand in her eye. Notes each day she wakes up her eyes are crusted closed from the discharge. Pt thinks she has pink eye and tried OTC eye drops without relief. She took ibuprofen 1 yesterday without relief. She was seen by OB/GYN history for annual visit, but states that her eye was not bothering her enough to discuss it with her doctor. She does not believe she had any foreign object enter into her eye, however she normally wears fake eyelashes, but hasn't since sx started. She doesn't wear contacts. She denies history of eye problems in the past. Pt denies fever, chills, sore throat, CP, SOB, n,v,d, abdominal pain, or any other complaints at this time.  Pain is not worse with movement of her eye.  The history is provided by the patient. No language interpreter was used.    Past Medical History:  Diagnosis Date  . Asthma   . Ectopic pregnancy 2013   2    There are no active problems to display for this patient.   Past Surgical History:  Procedure Laterality Date  . SALPINGECTOMY      OB History    Gravida Para Term Preterm AB Living   2       2 0   SAB TAB Ectopic Multiple Live Births       2           Home Medications    Prior  to Admission medications   Medication Sig Start Date End Date Taking? Authorizing Provider  albuterol (PROVENTIL HFA;VENTOLIN HFA) 108 (90 BASE) MCG/ACT inhaler Inhale 2 puffs into the lungs every 4 (four) hours as needed for wheezing or shortness of breath.    [provider]  cyclobenzaprine (FLEXERIL) 10 MG tablet Take 1 tablet (10 mg total) by mouth 2 (two) times daily as needed for muscle spasms. Patient not taking: Reported on 11/02/2016 04/24/16   Elpidio Anis, PA-C  dicyclomine (BENTYL) 20 MG tablet Take 1 tablet (20 mg total) by mouth 2 (two) times daily. Patient not taking: Reported on 11/02/2016 08/24/15   Sam, Ace Gins, PA-C  ibuprofen (ADVIL,MOTRIN) 800 MG tablet Take 1 tablet (800 mg total) by mouth every 8 (eight) hours as needed. 10/24/16   Ward, Chase Picket, PA-C  metroNIDAZOLE (FLAGYL) 500 MG tablet Take 1 tablet (500 mg total) by mouth 2 (two) times daily. 11/02/16   Brock Bad, MD  miconazole (MONISTAT 7) 2 % vaginal cream Place 1 Applicatorful vaginally at bedtime.    [provider]  ondansetron (ZOFRAN ODT) 4 MG disintegrating tablet Take 1 tablet (4 mg total) by mouth every 8 (eight) hours as needed for nausea or vomiting. Patient not taking: Reported on 11/02/2016 08/24/15   Sam,  Ace GinsSerena Y, PA-C  oxyCODONE-acetaminophen (PERCOCET/ROXICET) 5-325 MG tablet Take 1 tablet by mouth every 4 (four) hours as needed for severe pain. Patient not taking: Reported on 11/02/2016 04/24/16   Elpidio AnisUpstill, Shari, PA-C  trimethoprim-polymyxin b (POLYTRIM) ophthalmic solution Place 2 drops into the left eye every 6 (six) hours. 11/03/16 11/10/16  Jehan Bonano, PA-C    Family History Family History  Problem Relation Age of Onset  . Hypertension Mother   . Diabetes Mother   . Hypertension Father     Social History Social History  Substance Use Topics  . Smoking status: Former Games developermoker  . Smokeless tobacco: Never Used  . Alcohol use Yes     Comment: occ      Allergies   Shellfish allergy and Iodine   Review of Systems Review of Systems  Constitutional: Negative for chills and fever.  HENT: Positive for congestion (nasal). Negative for sore throat.   Eyes: Positive for pain, discharge, redness and visual disturbance. Negative for photophobia and itching.  Respiratory: Negative for shortness of breath.   Cardiovascular: Negative for chest pain.  Gastrointestinal: Negative for abdominal pain, diarrhea, nausea and vomiting.     Physical Exam Updated Vital Signs BP (!) 133/101 (BP Location: Left Arm)   Pulse 86   Temp 98.3 F (36.8 C) (Oral)   Resp 18   LMP 10/28/2016 (Exact Date)   SpO2 99%   Physical Exam  Constitutional: She is oriented to person, place, and time. She appears well-developed and well-nourished.  HENT:  Head: Normocephalic and atraumatic.  Right Ear: Tympanic membrane, external ear and ear canal normal.  Left Ear: Tympanic membrane, external ear and ear canal normal.  Nose: Mucosal edema present. Right sinus exhibits no maxillary sinus tenderness and no frontal sinus tenderness. Left sinus exhibits no maxillary sinus tenderness and no frontal sinus tenderness.  Mouth/Throat: Uvula is midline, oropharynx is clear and moist and mucous membranes are normal.  Eyes: Pupils are equal, round, and reactive to light. EOM are normal. No foreign body present in the right eye. No foreign body present in the left eye. Right conjunctiva is not injected. Left conjunctiva is injected.  Some swelling of the left eye noted. Left conjunctiva injected. No obvious drainage at this time. Visual acuity equal bilaterally. PERRL. EOMI without pain. PH equal bilaterally. Fluorescein stain showed no uptake, and no sign of corneal abrasion.  Neck: Normal range of motion.  Cardiovascular: Normal rate, regular rhythm and intact distal pulses.   Pulmonary/Chest: Effort normal and breath sounds normal. No respiratory distress. She has no  wheezes.  Abdominal: She exhibits no distension.  Musculoskeletal: Normal range of motion.  Lymphadenopathy:    She has no cervical adenopathy.  Neurological: She is alert and oriented to person, place, and time.  Skin: Skin is warm and dry.  Psychiatric: She has a normal mood and affect.  Nursing note and vitals reviewed.    ED Treatments / Results  DIAGNOSTIC STUDIES: Oxygen Saturation is 99% on RA, normal by my interpretation.   COORDINATION OF CARE: 11:14 AM-Discussed next steps with pt which includes antibiotic eye drops and following up with an eye doctor. If her sx worsen or change after using eye drops for 24 hours pt is to return to the ED for reevaluation. Pt verbalized understanding and is agreeable with the plan.   Labs (all labs ordered are listed, but only abnormal results are displayed) Labs Reviewed - No data to display  EKG  EKG Interpretation None  Radiology No results found.  Procedures Procedures (including critical care time)  Medications Ordered in ED Medications  oxyCODONE-acetaminophen (PERCOCET/ROXICET) 5-325 MG per tablet 1 tablet (1 tablet Oral Given 11/03/16 1101)  tetracaine (PONTOCAINE) 0.5 % ophthalmic solution 2 drop (2 drops Left Eye Given 11/03/16 1130)  fluorescein ophthalmic strip 1 strip (1 strip Left Eye Given 11/03/16 1130)     Initial Impression / Assessment and Plan / ED Course  I have reviewed the triage vital signs and the nursing notes.  Pertinent labs & imaging results that were available during my care of the patient were reviewed by me and considered in my medical decision making (see chart for details).     Patient presented with 6 days eye redness, and 3 days eye pain. Pain is described as feeling like a gritty/sandy feeling when she closes her lid. There is no pain with movement of her eye, and visual acuity equal bilaterally. Physical exam shows injected conjunctiva, and no obvious corneal abrasion. Doubt  orbital cellulitis, glaucoma, or corneal ulcer/abrasion at this time. Likely bacterial conjunctivitis. Will give antibiotic drops and have patient follow-up with ophthalmology. Return precautions given. Patient states she understands and agrees to plan.  Final Clinical Impressions(s) / ED Diagnoses   Final diagnoses:  Bacterial conjunctivitis of left eye    New Prescriptions Discharge Medication List as of 11/03/2016 11:23 AM    START taking these medications   Details  trimethoprim-polymyxin b (POLYTRIM) ophthalmic solution Place 2 drops into the left eye every 6 (six) hours., Starting Sat 11/03/2016, Until Sat 11/10/2016, Print       I personally performed the services described in this documentation, which was scribed in my presence. The recorded information has been reviewed and is accurate.     Alveria Apley, PA-C 11/03/16 1219    Phillis Haggis, MD 11/03/16 1228

## 2016-11-06 LAB — CYTOLOGY - PAP
Diagnosis: NEGATIVE
HPV (WINDOPATH): NOT DETECTED

## 2016-11-07 ENCOUNTER — Other Ambulatory Visit: Payer: Self-pay | Admitting: Obstetrics

## 2016-11-07 ENCOUNTER — Encounter (HOSPITAL_COMMUNITY): Payer: Self-pay

## 2016-11-07 ENCOUNTER — Emergency Department (HOSPITAL_COMMUNITY): Payer: BLUE CROSS/BLUE SHIELD

## 2016-11-07 ENCOUNTER — Emergency Department (HOSPITAL_COMMUNITY)
Admission: EM | Admit: 2016-11-07 | Discharge: 2016-11-07 | Disposition: A | Discharge status: Home / Self Care | Payer: BLUE CROSS/BLUE SHIELD | Attending: Emergency Medicine | Admitting: Emergency Medicine

## 2016-11-07 DIAGNOSIS — R112 Nausea with vomiting, unspecified: Secondary | ICD-10-CM | POA: Insufficient documentation

## 2016-11-07 DIAGNOSIS — R1030 Lower abdominal pain, unspecified: Secondary | ICD-10-CM | POA: Diagnosis present

## 2016-11-07 DIAGNOSIS — Z87891 Personal history of nicotine dependence: Secondary | ICD-10-CM | POA: Insufficient documentation

## 2016-11-07 DIAGNOSIS — J45909 Unspecified asthma, uncomplicated: Secondary | ICD-10-CM | POA: Insufficient documentation

## 2016-11-07 DIAGNOSIS — N760 Acute vaginitis: Principal | ICD-10-CM

## 2016-11-07 DIAGNOSIS — A749 Chlamydial infection, unspecified: Secondary | ICD-10-CM

## 2016-11-07 DIAGNOSIS — B9689 Other specified bacterial agents as the cause of diseases classified elsewhere: Secondary | ICD-10-CM

## 2016-11-07 LAB — URINALYSIS, ROUTINE W REFLEX MICROSCOPIC
BACTERIA UA: NONE SEEN
Bilirubin Urine: NEGATIVE
Glucose, UA: 50 mg/dL — AB
KETONES UR: NEGATIVE mg/dL
NITRITE: NEGATIVE
PH: 6 (ref 5.0–8.0)
Protein, ur: NEGATIVE mg/dL
SPECIFIC GRAVITY, URINE: 1.021 (ref 1.005–1.030)

## 2016-11-07 LAB — CBC
HEMATOCRIT: 37.9 % (ref 36.0–46.0)
HEMOGLOBIN: 12.4 g/dL (ref 12.0–15.0)
MCH: 28.4 pg (ref 26.0–34.0)
MCHC: 32.7 g/dL (ref 30.0–36.0)
MCV: 86.7 fL (ref 78.0–100.0)
PLATELETS: 279 10*3/uL (ref 150–400)
RBC: 4.37 MIL/uL (ref 3.87–5.11)
RDW: 12.5 % (ref 11.5–15.5)
WBC: 8.9 10*3/uL (ref 4.0–10.5)

## 2016-11-07 LAB — COMPREHENSIVE METABOLIC PANEL
ALBUMIN: 3.8 g/dL (ref 3.5–5.0)
ALT: 24 U/L (ref 14–54)
ANION GAP: 6 (ref 5–15)
AST: 18 U/L (ref 15–41)
Alkaline Phosphatase: 47 U/L (ref 38–126)
BILIRUBIN TOTAL: 0.3 mg/dL (ref 0.3–1.2)
BUN: 19 mg/dL (ref 6–20)
CHLORIDE: 109 mmol/L (ref 101–111)
CO2: 23 mmol/L (ref 22–32)
Calcium: 9 mg/dL (ref 8.9–10.3)
Creatinine, Ser: 0.79 mg/dL (ref 0.44–1.00)
GFR calc Af Amer: >60 mL/min (ref >60)
GFR calc non Af Amer: >60 mL/min (ref >60)
GLUCOSE: 89 mg/dL (ref 65–99)
POTASSIUM: 4.3 mmol/L (ref 3.5–5.1)
Sodium: 138 mmol/L (ref 135–145)
TOTAL PROTEIN: 7.4 g/dL (ref 6.5–8.1)

## 2016-11-07 LAB — WET PREP, GENITAL
CLUE CELLS WET PREP: NONE SEEN
Sperm: NONE SEEN
TRICH WET PREP: NONE SEEN
Yeast Wet Prep HPF POC: NONE SEEN

## 2016-11-07 LAB — LIPASE, BLOOD: LIPASE: 44 U/L (ref 11–51)

## 2016-11-07 LAB — POC OCCULT BLOOD, ED: FECAL OCCULT BLD: NEGATIVE

## 2016-11-07 LAB — GC/CHLAMYDIA PROBE AMP (~~LOC~~) NOT AT ARMC
CHLAMYDIA, DNA PROBE: POSITIVE — AB
Neisseria Gonorrhea: NEGATIVE

## 2016-11-07 LAB — CERVICOVAGINAL ANCILLARY ONLY
BACTERIAL VAGINITIS: POSITIVE — AB
Candida vaginitis: NEGATIVE
Chlamydia: POSITIVE — AB
NEISSERIA GONORRHEA: NEGATIVE
TRICH (WINDOWPATH): NEGATIVE

## 2016-11-07 LAB — I-STAT BETA HCG BLOOD, ED (MC, WL, AP ONLY)

## 2016-11-07 MED ORDER — AZITHROMYCIN 500 MG PO TABS: 1000.0000 mg | ORAL_TABLET | Freq: Every day | ORAL | 0 refills | Status: DC

## 2016-11-07 MED ORDER — CEFIXIME 400 MG PO CAPS: 400.0000 mg | ORAL_CAPSULE | Freq: Every day | ORAL | 0 refills | Status: DC

## 2016-11-07 MED ORDER — METRONIDAZOLE 500 MG PO TABS: 500.0000 mg | ORAL_TABLET | Freq: Two times a day (BID) | ORAL | 2 refills | Status: DC

## 2016-11-07 MED ORDER — DICYCLOMINE HCL 10 MG PO CAPS: 10.0000 mg | ORAL_CAPSULE | Freq: Once | ORAL | Status: DC

## 2016-11-07 MED ORDER — BARIUM SULFATE 2.1 % PO SUSP
450.0000 mL | ORAL | Status: DC | PRN
  Administered 2016-11-07: 450 mL via ORAL
  Filled 2016-11-07: qty 450

## 2016-11-07 MED ORDER — FENTANYL CITRATE (PF) 100 MCG/2ML IJ SOLN
50.0000 ug | Freq: Once | INTRAMUSCULAR | Status: AC
  Administered 2016-11-07: 50 ug via INTRAVENOUS
  Filled 2016-11-07: qty 2

## 2016-11-07 MED ORDER — KETOROLAC TROMETHAMINE 30 MG/ML IJ SOLN: 30.0000 mg | Freq: Once | INTRAMUSCULAR | Status: DC

## 2016-11-07 NOTE — ED Provider Notes (Signed)
abd pain, waiting abd/pelvis CT.  If neg, can be discharge.   10:15 AM Abd/pelvis CT without acute finding.  Pt resting comfortably  Recommend outpt f/u with PCP.  Return precaution discussed.    BP (!) 120/59 (BP Location: Right Arm)   Pulse 81   Temp 98.5 F (36.9 C) (Oral)   Resp 12   LMP 10/28/2016 (Exact Date)   SpO2 98%   Results for orders placed or performed during the hospital encounter of 11/07/16  Wet prep, genital  Result Value Ref Range   Yeast Wet Prep HPF POC NONE SEEN NONE SEEN   Trich, Wet Prep NONE SEEN NONE SEEN   Clue Cells Wet Prep HPF POC NONE SEEN NONE SEEN   WBC, Wet Prep HPF POC MANY (A) NONE SEEN   Sperm NONE SEEN   Lipase, blood  Result Value Ref Range   Lipase 44 11 - 51 U/L  Comprehensive metabolic panel  Result Value Ref Range   Sodium 138 135 - 145 mmol/L   Potassium 4.3 3.5 - 5.1 mmol/L   Chloride 109 101 - 111 mmol/L   CO2 23 22 - 32 mmol/L   Glucose, Bld 89 65 - 99 mg/dL   BUN 19 6 - 20 mg/dL   Creatinine, Ser 9.14 0.44 - 1.00 mg/dL   Calcium 9.0 8.9 - 78.2 mg/dL   Total Protein 7.4 6.5 - 8.1 g/dL   Albumin 3.8 3.5 - 5.0 g/dL   AST 18 15 - 41 U/L   ALT 24 14 - 54 U/L   Alkaline Phosphatase 47 38 - 126 U/L   Total Bilirubin 0.3 0.3 - 1.2 mg/dL   GFR calc non Af Amer >60 >60 mL/min   GFR calc Af Amer >60 >60 mL/min   Anion gap 6 5 - 15  CBC  Result Value Ref Range   WBC 8.9 4.0 - 10.5 K/uL   RBC 4.37 3.87 - 5.11 MIL/uL   Hemoglobin 12.4 12.0 - 15.0 g/dL   HCT 95.6 21.3 - 08.6 %   MCV 86.7 78.0 - 100.0 fL   MCH 28.4 26.0 - 34.0 pg   MCHC 32.7 30.0 - 36.0 g/dL   RDW 57.8 46.9 - 62.9 %   Platelets 279 150 - 400 K/uL  Urinalysis, Routine w reflex microscopic  Result Value Ref Range   Color, Urine YELLOW YELLOW   APPearance CLEAR CLEAR   Specific Gravity, Urine 1.021 1.005 - 1.030   pH 6.0 5.0 - 8.0   Glucose, UA 50 (A) NEGATIVE mg/dL   Hgb urine dipstick SMALL (A) NEGATIVE   Bilirubin Urine NEGATIVE NEGATIVE   Ketones, ur  NEGATIVE NEGATIVE mg/dL   Protein, ur NEGATIVE NEGATIVE mg/dL   Nitrite NEGATIVE NEGATIVE   Leukocytes, UA TRACE (A) NEGATIVE   RBC / HPF 0-5 0 - 5 RBC/hpf   WBC, UA 0-5 0 - 5 WBC/hpf   Bacteria, UA NONE SEEN NONE SEEN   Squamous Epithelial / LPF 0-5 (A) NONE SEEN   Mucous PRESENT   I-Stat Beta hCG blood, ED ([facility], [facility], [facility] only)  Result Value Ref Range   I-stat hCG, quantitative <5.0 <5 mIU/mL   Comment 3          POC occult blood, ED  Result Value Ref Range   Fecal Occult Bld NEGATIVE NEGATIVE   Ct Abdomen Pelvis Wo Contrast  Result Date: 11/07/2016 CLINICAL DATA:  Abdominal pain, gastroenteritis or colitis suspected. Left lower quadrant pain. EXAM: CT ABDOMEN AND PELVIS  WITHOUT CONTRAST TECHNIQUE: Multidetector CT imaging of the abdomen and pelvis was performed following the standard protocol without IV contrast. COMPARISON:  06/08/2013 FINDINGS: Lower chest: Lung bases are clear. No effusions. Heart is normal size. Hepatobiliary: Geographic fatty infiltration throughout the liver. Gallbladder unremarkable. Pancreas: No focal abnormality or ductal dilatation. Spleen: No focal abnormality.  Normal size. Adrenals/Urinary Tract: No adrenal abnormality. No focal renal abnormality. No stones or hydronephrosis. Urinary bladder is unremarkable. Stomach/Bowel: Normal appendix. Stomach, large and small bowel grossly unremarkable. Vascular/Lymphatic: No evidence of aneurysm or adenopathy. Reproductive: Uterus and adnexa unremarkable.  No mass. Other: No free fluid or free air. Musculoskeletal: No acute bony abnormality. IMPRESSION: No acute findings in the abdomen or pelvis. Geographic fatty infiltration of the liver. Electronically Signed   By: Charlett Nose M.D.   On: 11/07/2016 09:47   US Transvaginal Non-ob  Result Date: 11/07/2016 CLINICAL DATA:  Acute onset of left lower pelvic pain. Initial encounter. EXAM: TRANSABDOMINAL AND TRANSVAGINAL ULTRASOUND OF PELVIS DOPPLER ULTRASOUND OF OVARIES  TECHNIQUE: Both transabdominal and transvaginal ultrasound examinations of the pelvis were performed. Transabdominal technique was performed for global imaging of the pelvis including uterus, ovaries, adnexal regions, and pelvic cul-de-sac. It was necessary to proceed with endovaginal exam following the transabdominal exam to visualize the ovaries. Color and duplex Doppler ultrasound was utilized to evaluate blood flow to the ovaries. COMPARISON:  Pelvic ultrasound performed 06/07/2013, and CT of the abdomen and pelvis performed 06/08/2013 FINDINGS: Uterus Measurements: 6.7 x 3.3 x 3.8 cm. No fibroids or other mass visualized. Endometrium Thickness: 0.6 cm.  No focal abnormality visualized. Right ovary Measurements: 4.8 x 2.6 x 2.4 cm. Normal appearance/no adnexal mass. Left ovary Measurements: 4.5 x 2.0 x 2.5 cm. Normal appearance/no adnexal mass. Pulsed Doppler evaluation of both ovaries demonstrates normal low-resistance arterial and venous waveforms. Other findings No abnormal free fluid. IMPRESSION: Unremarkable pelvic ultrasound.  No evidence for ovarian torsion. Electronically Signed   By: Roanna Raider M.D.   On: 11/07/2016 05:36   US Pelvis Complete  Result Date: 11/07/2016 CLINICAL DATA:  Acute onset of left lower pelvic pain. Initial encounter. EXAM: TRANSABDOMINAL AND TRANSVAGINAL ULTRASOUND OF PELVIS DOPPLER ULTRASOUND OF OVARIES TECHNIQUE: Both transabdominal and transvaginal ultrasound examinations of the pelvis were performed. Transabdominal technique was performed for global imaging of the pelvis including uterus, ovaries, adnexal regions, and pelvic cul-de-sac. It was necessary to proceed with endovaginal exam following the transabdominal exam to visualize the ovaries. Color and duplex Doppler ultrasound was utilized to evaluate blood flow to the ovaries. COMPARISON:  Pelvic ultrasound performed 06/07/2013, and CT of the abdomen and pelvis performed 06/08/2013 FINDINGS: Uterus Measurements:  6.7 x 3.3 x 3.8 cm. No fibroids or other mass visualized. Endometrium Thickness: 0.6 cm.  No focal abnormality visualized. Right ovary Measurements: 4.8 x 2.6 x 2.4 cm. Normal appearance/no adnexal mass. Left ovary Measurements: 4.5 x 2.0 x 2.5 cm. Normal appearance/no adnexal mass. Pulsed Doppler evaluation of both ovaries demonstrates normal low-resistance arterial and venous waveforms. Other findings No abnormal free fluid. IMPRESSION: Unremarkable pelvic ultrasound.  No evidence for ovarian torsion. Electronically Signed   By: Roanna Raider M.D.   On: 11/07/2016 05:36   [place] Art/ven Flow Abd Pelv Doppler  Result Date: 11/07/2016 CLINICAL DATA:  Acute onset of left lower pelvic pain. Initial encounter. EXAM: TRANSABDOMINAL AND TRANSVAGINAL ULTRASOUND OF PELVIS DOPPLER ULTRASOUND OF OVARIES TECHNIQUE: Both transabdominal and transvaginal ultrasound examinations of the pelvis were performed. Transabdominal technique was performed for global imaging of the  pelvis including uterus, ovaries, adnexal regions, and pelvic cul-de-sac. It was necessary to proceed with endovaginal exam following the transabdominal exam to visualize the ovaries. Color and duplex Doppler ultrasound was utilized to evaluate blood flow to the ovaries. COMPARISON:  Pelvic ultrasound performed 06/07/2013, and CT of the abdomen and pelvis performed 06/08/2013 FINDINGS: Uterus Measurements: 6.7 x 3.3 x 3.8 cm. No fibroids or other mass visualized. Endometrium Thickness: 0.6 cm.  No focal abnormality visualized. Right ovary Measurements: 4.8 x 2.6 x 2.4 cm. Normal appearance/no adnexal mass. Left ovary Measurements: 4.5 x 2.0 x 2.5 cm. Normal appearance/no adnexal mass. Pulsed Doppler evaluation of both ovaries demonstrates normal low-resistance arterial and venous waveforms. Other findings No abnormal free fluid. IMPRESSION: Unremarkable pelvic ultrasound.  No evidence for ovarian torsion. Electronically Signed   By: Roanna RaiderJeffery  Chang M.D.   On:  11/07/2016 05:36   Dg Knee Complete 4 Views Right  Result Date: 10/24/2016 CLINICAL DATA:  Right anterior knee pain and swelling. Previous ACL tear. No recent injuries. EXAM: RIGHT KNEE - COMPLETE 4+ VIEW COMPARISON:  02/06/2014. FINDINGS: Mild tricompartmental spur formation.  No effusion. IMPRESSION: Stable mild tricompartmental degenerative changes. Electronically Signed   By: Beckie SaltsSteven  Reid M.D.   On: 10/24/2016 19:28      Fayrene Helperran, Pinkey Mcjunkin, PA-C 11/07/16 1016    Palumbo, April, MD 11/08/16 [phone]

## 2016-11-07 NOTE — ED Provider Notes (Signed)
[facility]-EMERGENCY DEPT Provider Note   CSN: [id number] Arrival date & time: 11/07/16  0147     History   Chief Complaint Chief Complaint  Patient presents with  . Abdominal Pain    HPI JARIYAH HACKLEY is a 36 y.o. female with a hx of Asthma, ectopic pregnancy 2 presents to the Emergency Department complaining of gradual, persistent, progressively worsening left lower quadrant abdominal pain onset yesterday with acute worsening last night. Associated symptoms include vomiting 3. She reports emesis was nonbloody and nonbilious. Patient also reports bowel movement today that was very dark and runny but she denies overt melena or hematochezia. She denies history of GI bleed. She is not anticoagulated.  Patient reports this pain is unlike anything she has ever had before. Nothing makes it better. Movement and palpation makes it worse. Patient denies international travel or known sick contacts. She denies fevers or chills, headache, neck pain, chest pain, shortness of breath, weakness, dizziness, syncope, dysuria, hematuria, vaginal discharge, vaginal bleeding. Patient reports essentially active with one female partner, her husband. She denies risk for STD.  The history is provided by the patient and medical records. No language interpreter was used.    Past Medical History:  Diagnosis Date  . Asthma   . Ectopic pregnancy 2013   2    There are no active problems to display for this patient.   Past Surgical History:  Procedure Laterality Date  . SALPINGECTOMY      OB History    Gravida Para Term Preterm AB Living   2       2 0   SAB TAB Ectopic Multiple Live Births       2           Home Medications    Prior to Admission medications   Medication Sig Start Date End Date Taking? Authorizing Provider  albuterol (PROVENTIL HFA;VENTOLIN HFA) 108 (90 BASE) MCG/ACT inhaler Inhale 2 puffs into the lungs every 4 (four) hours as needed for wheezing or shortness of breath.   Yes  [provider]  ibuprofen (ADVIL,MOTRIN) 800 MG tablet Take 1 tablet (800 mg total) by mouth every 8 (eight) hours as needed. Patient taking differently: Take 800 mg by mouth every 8 (eight) hours as needed for moderate pain.  10/24/16  Yes Ward, Chase Picket, PA-C  metroNIDAZOLE (FLAGYL) 500 MG tablet Take 1 tablet (500 mg total) by mouth 2 (two) times daily. 11/02/16  Yes Brock Bad, MD  trimethoprim-polymyxin b (POLYTRIM) ophthalmic solution Place 2 drops into the left eye every 6 (six) hours. 11/03/16 11/10/16 Yes Caccavale, Sophia, PA-C  cyclobenzaprine (FLEXERIL) 10 MG tablet Take 1 tablet (10 mg total) by mouth 2 (two) times daily as needed for muscle spasms. Patient not taking: Reported on 11/02/2016 04/24/16   Elpidio Anis, PA-C  dicyclomine (BENTYL) 20 MG tablet Take 1 tablet (20 mg total) by mouth 2 (two) times daily. Patient not taking: Reported on 11/02/2016 08/24/15   Sam, Ace Gins, PA-C  ondansetron (ZOFRAN ODT) 4 MG disintegrating tablet Take 1 tablet (4 mg total) by mouth every 8 (eight) hours as needed for nausea or vomiting. Patient not taking: Reported on 11/02/2016 08/24/15   Carlene Coria, PA-C  oxyCODONE-acetaminophen (PERCOCET/ROXICET) 5-325 MG tablet Take 1 tablet by mouth every 4 (four) hours as needed for severe pain. Patient not taking: Reported on 11/02/2016 04/24/16   Elpidio Anis, PA-C    Family History Family History  Problem Relation Age of Onset  .  Hypertension Mother   . Diabetes Mother   . Hypertension Father     Social History Social History  Substance Use Topics  . Smoking status: Former Games developer  . Smokeless tobacco: Never Used  . Alcohol use Yes     Comment: occ     Allergies   Shellfish allergy and Iodine   Review of Systems Review of Systems  Constitutional: Negative for appetite change, diaphoresis, fatigue, fever and unexpected weight change.  HENT: Negative for mouth sores.   Eyes: Negative for visual disturbance.    Respiratory: Negative for cough, chest tightness, shortness of breath and wheezing.   Cardiovascular: Negative for chest pain.  Gastrointestinal: Positive for abdominal pain, nausea and vomiting. Negative for constipation and diarrhea.  Endocrine: Negative for polydipsia, polyphagia and polyuria.  Genitourinary: Negative for dysuria, frequency, hematuria and urgency.  Musculoskeletal: Negative for back pain and neck stiffness.  Skin: Negative for rash.  Allergic/Immunologic: Negative for immunocompromised state.  Neurological: Negative for syncope, light-headedness and headaches.  Hematological: Does not bruise/bleed easily.  Psychiatric/Behavioral: Negative for sleep disturbance. The patient is not nervous/anxious.   All other systems reviewed and are negative.    Physical Exam Updated Vital Signs BP 129/74 (BP Location: Right Arm)   Pulse 83   Temp 98.5 F (36.9 C) (Oral)   Resp 14   LMP 10/28/2016 (Exact Date)   SpO2 99%   Physical Exam  Constitutional: She appears well-developed and well-nourished. No distress.  Awake, alert, nontoxic appearance  HENT:  Head: Normocephalic and atraumatic.  Mouth/Throat: Oropharynx is clear and moist. No oropharyngeal exudate.  Eyes: Conjunctivae are normal. No scleral icterus.  Neck: Normal range of motion. Neck supple.  Cardiovascular: Normal rate, regular rhythm, normal heart sounds and intact distal pulses.   No murmur heard. Pulmonary/Chest: Effort normal and breath sounds normal. No respiratory distress. She has no wheezes.  Equal chest expansion  Abdominal: Soft. Bowel sounds are normal. She exhibits no mass. There is tenderness in the suprapubic area and left lower quadrant. There is guarding. There is no rigidity, no rebound, no CVA tenderness, no tenderness at McBurney's point and negative Murphy's sign. Hernia confirmed negative in the right inguinal area and confirmed negative in the left inguinal area.  Exam limited by Obese  abdomen.  Genitourinary: Uterus normal. No labial fusion. There is no rash, tenderness or lesion on the right labia. There is no rash, tenderness or lesion on the left labia. Uterus is not deviated, not enlarged, not fixed and not tender. Cervix exhibits no motion tenderness, no discharge and no friability. Right adnexum displays no mass, no tenderness and no fullness. Left adnexum displays no mass, no tenderness and no fullness. No erythema, tenderness or bleeding in the vagina. No foreign body in the vagina. No signs of injury around the vagina. No vaginal discharge found.  Genitourinary Comments: DRE with brown stool.  Musculoskeletal: Normal range of motion. She exhibits no edema.  Lymphadenopathy:       Right: No inguinal adenopathy present.       Left: No inguinal adenopathy present.  Neurological: She is alert.  Speech is clear and goal oriented Moves extremities without ataxia  Skin: Skin is warm and dry. She is not diaphoretic. No erythema.  Psychiatric: She has a normal mood and affect.  Nursing note and vitals reviewed.    ED Treatments / Results  Labs (all labs ordered are listed, but only abnormal results are displayed) Labs Reviewed  WET PREP, GENITAL - Abnormal; Notable  for the following:       Result Value   WBC, Wet Prep HPF POC MANY (*)    All other components within normal limits  URINALYSIS, ROUTINE W REFLEX MICROSCOPIC - Abnormal; Notable for the following:    Glucose, UA 50 (*)    Hgb urine dipstick SMALL (*)    Leukocytes, UA TRACE (*)    Squamous Epithelial / LPF 0-5 (*)    All other components within normal limits  LIPASE, BLOOD  COMPREHENSIVE METABOLIC PANEL  CBC  I-STAT BETA HCG BLOOD, ED ([facility], [facility], [facility] ONLY)  POC OCCULT BLOOD, ED  GC/CHLAMYDIA PROBE AMP ([place] NOT AT [facility]     Radiology [place] Transvaginal Non-ob  Result Date: 11/07/2016 CLINICAL DATA:  Acute onset of left lower pelvic pain. Initial encounter. EXAM: TRANSABDOMINAL AND TRANSVAGINAL  ULTRASOUND OF PELVIS DOPPLER ULTRASOUND OF OVARIES TECHNIQUE: Both transabdominal and transvaginal ultrasound examinations of the pelvis were performed. Transabdominal technique was performed for global imaging of the pelvis including uterus, ovaries, adnexal regions, and pelvic cul-de-sac. It was necessary to proceed with endovaginal exam following the transabdominal exam to visualize the ovaries. Color and duplex Doppler ultrasound was utilized to evaluate blood flow to the ovaries. COMPARISON:  Pelvic ultrasound performed 06/07/2013, and CT of the abdomen and pelvis performed 06/08/2013 FINDINGS: Uterus Measurements: 6.7 x 3.3 x 3.8 cm. No fibroids or other mass visualized. Endometrium Thickness: 0.6 cm.  No focal abnormality visualized. Right ovary Measurements: 4.8 x 2.6 x 2.4 cm. Normal appearance/no adnexal mass. Left ovary Measurements: 4.5 x 2.0 x 2.5 cm. Normal appearance/no adnexal mass. Pulsed Doppler evaluation of both ovaries demonstrates normal low-resistance arterial and venous waveforms. Other findings No abnormal free fluid. IMPRESSION: Unremarkable pelvic ultrasound.  No evidence for ovarian torsion. Electronically Signed   By: Roanna RaiderJeffery  Chang M.D.   On: 11/07/2016 05:36   [place] Pelvis Complete  Result Date: 11/07/2016 CLINICAL DATA:  Acute onset of left lower pelvic pain. Initial encounter. EXAM: TRANSABDOMINAL AND TRANSVAGINAL ULTRASOUND OF PELVIS DOPPLER ULTRASOUND OF OVARIES TECHNIQUE: Both transabdominal and transvaginal ultrasound examinations of the pelvis were performed. Transabdominal technique was performed for global imaging of the pelvis including uterus, ovaries, adnexal regions, and pelvic cul-de-sac. It was necessary to proceed with endovaginal exam following the transabdominal exam to visualize the ovaries. Color and duplex Doppler ultrasound was utilized to evaluate blood flow to the ovaries. COMPARISON:  Pelvic ultrasound performed 06/07/2013, and CT of the abdomen and pelvis  performed 06/08/2013 FINDINGS: Uterus Measurements: 6.7 x 3.3 x 3.8 cm. No fibroids or other mass visualized. Endometrium Thickness: 0.6 cm.  No focal abnormality visualized. Right ovary Measurements: 4.8 x 2.6 x 2.4 cm. Normal appearance/no adnexal mass. Left ovary Measurements: 4.5 x 2.0 x 2.5 cm. Normal appearance/no adnexal mass. Pulsed Doppler evaluation of both ovaries demonstrates normal low-resistance arterial and venous waveforms. Other findings No abnormal free fluid. IMPRESSION: Unremarkable pelvic ultrasound.  No evidence for ovarian torsion. Electronically Signed   By: Roanna RaiderJeffery  Chang M.D.   On: 11/07/2016 05:36   [place] Art/ven Flow Abd Pelv Doppler  Result Date: 11/07/2016 CLINICAL DATA:  Acute onset of left lower pelvic pain. Initial encounter. EXAM: TRANSABDOMINAL AND TRANSVAGINAL ULTRASOUND OF PELVIS DOPPLER ULTRASOUND OF OVARIES TECHNIQUE: Both transabdominal and transvaginal ultrasound examinations of the pelvis were performed. Transabdominal technique was performed for global imaging of the pelvis including uterus, ovaries, adnexal regions, and pelvic cul-de-sac. It was necessary to proceed with endovaginal exam following the transabdominal exam to visualize the ovaries.  Color and duplex Doppler ultrasound was utilized to evaluate blood flow to the ovaries. COMPARISON:  Pelvic ultrasound performed 06/07/2013, and CT of the abdomen and pelvis performed 06/08/2013 FINDINGS: Uterus Measurements: 6.7 x 3.3 x 3.8 cm. No fibroids or other mass visualized. Endometrium Thickness: 0.6 cm.  No focal abnormality visualized. Right ovary Measurements: 4.8 x 2.6 x 2.4 cm. Normal appearance/no adnexal mass. Left ovary Measurements: 4.5 x 2.0 x 2.5 cm. Normal appearance/no adnexal mass. Pulsed Doppler evaluation of both ovaries demonstrates normal low-resistance arterial and venous waveforms. Other findings No abnormal free fluid. IMPRESSION: Unremarkable pelvic ultrasound.  No evidence for ovarian torsion.  Electronically Signed   By: Roanna Raider M.D.   On: 11/07/2016 05:36    Procedures Procedures (including critical care time)  Medications Ordered in ED Medications  fentaNYL (SUBLIMAZE) injection 50 mcg (50 mcg Intravenous Given 11/07/16 0459)     Initial Impression / Assessment and Plan / ED Course  I have reviewed the triage vital signs and the nursing notes.  Pertinent labs & imaging results that were available during my care of the patient were reviewed by me and considered in my medical decision making (see chart for details).  Clinical Course as of Nov 07 632  Wed Nov 07, 2016  0630 Patient with continued significant 10/10 pain. Ultrasound is without acute abnormality and no evidence of torsion. On discussion with patient of risk versus benefit of CT scan. She wishes to proceed with CT of her abdomen.  [HM]    Clinical Course User Index [HM] Shericka Johnstone, Boyd Kerbs    Patient presents with lower abdominal pain. Pregnancy test negative. No evidence of UTI. Labs are reassuring. Pelvic exam without cervical motion tenderness, doubt PID.  Patient with initial improvement pain has returned. Risk versus benefit of CT scan. Patient wishes to proceed.  At shift change care was transferred to Fayrene Helper, PA-C who will follow pending studies, re-evaulate and determine disposition.     Final Clinical Impressions(s) / ED Diagnoses   Final diagnoses:  Lower abdominal pain    New Prescriptions New Prescriptions   No medications on file     Milta Deiters 11/07/16 [id number]    Palumbo, April, MD 11/07/16 [phone]

## 2016-11-07 NOTE — ED Triage Notes (Signed)
Pt complains of abdominal pain for three days, first the pain was on the right and tonight it was on the left Pt states she has vomited a few times and when she had a BM it was really dark and runny

## 2016-11-08 ENCOUNTER — Telehealth: Payer: Self-pay | Admitting: *Deleted

## 2016-11-08 NOTE — Telephone Encounter (Signed)
Call to patient - she is feeling some better. Verified she did pick up and take her treatment. Let her know that the hospital also tested her and they may try to reach out to her regarding the positive results they received as well.

## 2017-11-06 DIAGNOSIS — R5383 Other fatigue: Secondary | ICD-10-CM | POA: Diagnosis not present

## 2017-11-06 DIAGNOSIS — R635 Abnormal weight gain: Secondary | ICD-10-CM | POA: Diagnosis not present

## 2017-11-06 DIAGNOSIS — Z713 Dietary counseling and surveillance: Secondary | ICD-10-CM | POA: Diagnosis not present

## 2017-11-06 DIAGNOSIS — E559 Vitamin D deficiency, unspecified: Secondary | ICD-10-CM | POA: Diagnosis not present

## 2017-11-06 DIAGNOSIS — R0683 Snoring: Secondary | ICD-10-CM | POA: Diagnosis not present

## 2017-11-06 DIAGNOSIS — L83 Acanthosis nigricans: Secondary | ICD-10-CM | POA: Diagnosis not present

## 2017-12-11 DIAGNOSIS — Z9079 Acquired absence of other genital organ(s): Secondary | ICD-10-CM | POA: Diagnosis not present

## 2017-12-11 DIAGNOSIS — N979 Female infertility, unspecified: Secondary | ICD-10-CM | POA: Diagnosis not present

## 2017-12-26 ENCOUNTER — Ambulatory Visit (INDEPENDENT_AMBULATORY_CARE_PROVIDER_SITE_OTHER): Payer: 59 | Admitting: Obstetrics

## 2017-12-26 ENCOUNTER — Encounter: Payer: Self-pay | Admitting: Obstetrics

## 2017-12-26 VITALS — BP 114/72 | HR 75 | Wt 289.0 lb

## 2017-12-26 DIAGNOSIS — Z113 Encounter for screening for infections with a predominantly sexual mode of transmission: Secondary | ICD-10-CM | POA: Diagnosis not present

## 2017-12-26 DIAGNOSIS — Z124 Encounter for screening for malignant neoplasm of cervix: Secondary | ICD-10-CM

## 2017-12-26 DIAGNOSIS — N9412 Deep dyspareunia: Secondary | ICD-10-CM

## 2017-12-26 DIAGNOSIS — Z6841 Body Mass Index (BMI) 40.0 and over, adult: Secondary | ICD-10-CM

## 2017-12-26 DIAGNOSIS — N946 Dysmenorrhea, unspecified: Secondary | ICD-10-CM

## 2017-12-26 DIAGNOSIS — Z1151 Encounter for screening for human papillomavirus (HPV): Secondary | ICD-10-CM

## 2017-12-26 DIAGNOSIS — N898 Other specified noninflammatory disorders of vagina: Secondary | ICD-10-CM | POA: Diagnosis not present

## 2017-12-26 DIAGNOSIS — Z01419 Encounter for gynecological examination (general) (routine) without abnormal findings: Secondary | ICD-10-CM

## 2017-12-26 DIAGNOSIS — Z3169 Encounter for other general counseling and advice on procreation: Secondary | ICD-10-CM

## 2017-12-26 DIAGNOSIS — N939 Abnormal uterine and vaginal bleeding, unspecified: Secondary | ICD-10-CM

## 2017-12-26 MED ORDER — PRENATAL PLUS 27-1 MG PO TABS: 1.0000 | ORAL_TABLET | Freq: Every day | ORAL | 11 refills | Status: DC

## 2017-12-26 MED ORDER — IBUPROFEN 800 MG PO TABS
800.0000 mg | ORAL_TABLET | Freq: Three times a day (TID) | ORAL | 5 refills | Status: DC | PRN
Start: 2017-12-26 — End: 2018-04-28

## 2017-12-26 NOTE — Patient Instructions (Signed)
Dyspareunia, Female Dyspareunia is pain that is associated with sexual activity. This can affect any part of the genitals or lower abdomen, and there are many possible causes. This condition ranges from mild to severe. Depending on the cause, dyspareunia may get better with treatment, or it may return (recur) over time. What are the causes? The cause of this condition is not always known. Possible causes include:  Cancer.  Psychological factors, such as depression, anxiety, or previous traumatic experiences.  Severe pain and tenderness of the skin around the vagina (vulva) when it is touched (vulvar vestibulitis syndrome).  Infection of the pelvis or the vulva.  Infection of the vagina.  Painful, involuntary tightening (contraction) of the vaginal muscles when anything is put inside the vagina (vaginismus).  Allergic reaction.  Ovarian cysts.  Solid growths of tissue (tumors) in the ovaries or the uterus.  Scar tissue in the ovaries, vagina, or pelvis.  Vaginal dryness.  Thinning of the tissue (atrophy) of the vulva and vagina.  Skin conditions that affect the vulva (vulvar dermatoses), such as lichen sclerosus or lichen planus.  Endometriosis.  Tubal pregnancy.  A tilted uterus.  Uterine prolapse.  Adhesions in the vagina.  Bladder problems.  Intestinal problems.  Certain medicines.  Medical conditions such as diabetes, arthritis, or thyroid disease.  What increases the risk? The following factors may make you more likely to develop this condition:  Having experienced physical or sexual trauma.  Having given birth more than once.  Taking birth control pills.  Having gone through menopause.  Having recently given birth, typically within the past 3-6 months.  Breastfeeding.  What are the signs or symptoms? The main symptom of this condition is pain in any part of the genitals or lower abdomen during or after sexual activity. This may include pain  during sexual arousal, genital stimulation, or orgasm. Pain may get worse when anything is inserted into the vagina, or when the genitals are touched in any way, such as when sitting or wearing pants. Pain can range from mild to severe, depending on the cause of the condition. In some cases, symptoms go away with treatment and return (recur) at a later date. How is this diagnosed? This condition may be diagnosed based on:  Your symptoms, including: ? Where your pain is located. ? When your pain occurs.  Your medical history.  A physical exam. This may include a pelvic exam and a Pap test. This is a screening test that is used to check for signs of cancer of the vagina, cervix, and uterus.  Tests, including: ? Blood tests. ? Ultrasound. This uses sound waves to make a picture of the area that is being tested. ? Urine culture. This test involves checking a urine sample for signs of infection. ? Culture test. This is when your health care provider uses a swab to collect a sample of vaginal fluid. The sample is checked for signs of infection. ? X-rays. ? MRI. ? CT scan. ? Laparoscopy. This is a procedure in which a small incision is made in your lower abdomen and a lighted, pencil-sized instrument (laparoscope) is passed through the incision and used to look inside your pelvis.  You may be referred to a health care provider who specializes in women's health (gynecologist). In some cases, diagnosing the cause of dyspareunia can be difficult. How is this treated? Treatment depends on the cause of your condition and your symptoms. In most cases, you may need to stop sexual activity until your symptoms   improve. Treatment may include:  Lubricants.  Kegel exercises or vaginal dilators.  Medicated skin creams.  Medicated vaginal creams.  Hormonal therapy.  Antibiotic medicine to prevent or fight infection.  Medicines that help to relieve pain.  Medicines that treat depression  (antidepressants).  Psychological counseling.  Sex therapy.  Surgery.  Follow these instructions at home: Lifestyle  Avoid tight clothing and irritating materials around your genital and abdominal area.  Use water-based lubricants as needed. Avoid oil-based lubricants.  Do not use any products that irritate you. This may include certain condoms, spermicides, lubricants, soaps, tampons, vaginal sprays, or douches.  Always practice safe sex. Talk with your health care provider about which form of birth control (contraception) is best for you.  Maintain open communication with your sexual partner. General instructions  Take over-the-counter and prescription medicines only as told by your health care provider.  If you had tests done, it is your responsibility to get your tests results. Ask your health care provider or the department performing the test when your results will be ready.  Urinate before you engage in sexual activity.  Consider joining a support group.  Keep all follow-up visits as told by your health care provider. This is important. Contact a health care provider if:  You develop vaginal bleeding after sexual intercourse.  You develop a lump at the opening of your vagina. Seek medical care even if the lump is painless.  You have: ? Abnormal vaginal discharge. ? Vaginal dryness. ? Itchiness or irritation of your vulva or vagina. ? A new rash. ? Symptoms that get worse or do not improve with treatment. ? A fever. ? Pain when you urinate. ? Blood in your urine. Get help right away if:  You develop severe pain in your abdomen during or shortly after sexual intercourse.  You pass out after having sexual intercourse. This information is not intended to replace advice given to you by your health care provider. Make sure you discuss any questions you have with your health care provider. Document Released: 04/01/2007 Document Revised: 07/22/2015 Document  Reviewed: 10/12/2014 Elsevier Interactive Patient Education  2018 [employer].  Dysfunctional Uterine Bleeding Dysfunctional uterine bleeding is abnormal bleeding from the uterus. Dysfunctional uterine bleeding includes:  A period that comes earlier or later than usual.  A period that is lighter, heavier, or has blood clots.  Bleeding between periods.  Skipping one or more periods.  Bleeding after sexual intercourse.  Bleeding after menopause.  Follow these instructions at home: Pay attention to any changes in your symptoms. Follow these instructions to help with your condition: Eating and drinking  Eat well-balanced meals. Include foods that are high in iron, such as liver, meat, shellfish, green leafy vegetables, and eggs.  If you become constipated: ? Drink plenty of water. ? Eat fruits and vegetables that are high in water and fiber, such as spinach, carrots, raspberries, apples, and mango. Medicines  Take over-the-counter and prescription medicines only as told by your health care provider.  Do not change medicines without talking with your health care provider.  Aspirin or medicines that contain aspirin may make the bleeding worse. Do not take those medicines: ? During the week before your period. ? During your period.  If you were prescribed iron pills, take them as told by your health care provider. Iron pills help to replace iron that your body loses because of this condition. Activity  If you need to change your sanitary pad or tampon more than one time  every 2 hours: ? Lie in bed with your feet raised (elevated). ? Place a cold pack on your lower abdomen. ? Rest as much as possible until the bleeding stops or slows down.  Do not try to lose weight until the bleeding has stopped and your blood iron level is back to normal. Other Instructions  For two months, write down: ? When your period starts. ? When your period ends. ? When any abnormal bleeding  occurs. ? What problems you notice.  Keep all follow up visits as told by your health care provider. This is important. Contact a health care provider if:  You get light-headed or weak.  You have nausea and vomiting.  You cannot eat or drink without vomiting.  You feel dizzy or have diarrhea while you are taking medicines.  You are taking birth control pills or hormones, and you want to change them or stop taking them. Get help right away if:  You develop a fever or chills.  You need to change your sanitary pad or tampon more than one time per hour.  Your bleeding becomes heavier, or your flow contains clots more often.  You develop pain in your abdomen.  You lose consciousness.  You develop a rash. This information is not intended to replace advice given to you by your health care provider. Make sure you discuss any questions you have with your health care provider. Document Released: 03/09/2000 Document Revised: 08/18/2015 Document Reviewed: 06/07/2014 Elsevier Interactive Patient Education  2018 [employer].  Preparing for Pregnancy If you are considering becoming pregnant, make an appointment to see your regular health care provider to learn how to prepare for a safe and healthy pregnancy (preconception care). During a preconception care visit, your health care provider will:  Do a complete physical exam, including a Pap test.  Take a complete medical history.  Give you information, answer your questions, and help you resolve problems.  Preconception checklist Medical history  Tell your health care provider about any current or past medical conditions. Your pregnancy or your ability to become pregnant may be affected by chronic conditions, such as diabetes, chronic hypertension, and thyroid problems.  Include your family's medical history as well as your partner's medical history.  Tell your health care provider about any history of STIs (sexually transmitted  infections).These can affect your pregnancy. In some cases, they can be passed to your baby. Discuss any concerns that you have about STIs.  If indicated, discuss the benefits of genetic testing. This testing will show whether there are any genetic conditions that may be passed from you or your partner to your baby.  Tell your health care provider about: ? Any problems you have had with conception or pregnancy. ? Any medicines you take. These include vitamins, herbal supplements, and over-the-counter medicines. ? Your history of immunizations. Discuss any vaccinations that you may need.  Diet  Ask your health care provider what to include in a healthy diet that has a balance of nutrients. This is especially important when you are pregnant or preparing to become pregnant.  Ask your health care provider to help you reach a healthy weight before pregnancy. ? If you are overweight, you may be at higher risk for certain complications, such as high blood pressure, diabetes, and preterm birth. ? If you are underweight, you are more likely to have a baby who has a low birth weight.  Lifestyle, work, and home  Let your health care provider know: ? About any  lifestyle habits that you have, such as alcohol use, drug use, or smoking. ? About recreational activities that may put you at risk during pregnancy, such as downhill skiing and certain exercise programs. ? Tell your health care provider about any international travel, especially any travel to places with an active [place] virus outbreak. ? About harmful substances that you may be exposed to at work or at home. These include chemicals, pesticides, radiation, or even litter boxes. ? If you do not feel safe at home.  Mental health  Tell your health care provider about: ? Any history of mental health conditions, including feelings of depression, sadness, or anxiety. ? Any medicines that you take for a mental health condition. These include herbs  and supplements.  Home instructions to prepare for pregnancy Lifestyle  Eat a balanced diet. This includes fresh fruits and vegetables, whole grains, lean meats, low-fat dairy products, healthy fats, and foods that are high in fiber. Ask to meet with a nutritionist or registered dietitian for assistance with meal planning and goals.  Get regular exercise. Try to be active for at least 30 minutes a day on most days of the week. Ask your health care provider which activities are safe during pregnancy.  Do not use any products that contain nicotine or tobacco, such as cigarettes and e-cigarettes. If you need help quitting, ask your health care provider.  Do not drink alcohol.  Do not take illegal drugs.  Maintain a healthy weight. Ask your health care provider what weight range is right for you.  General instructions  Keep an accurate record of your menstrual periods. This makes it easier for your health care provider to determine your baby's due date.  Begin taking prenatal vitamins and folic acid supplements daily as directed by your health care provider.  Manage any chronic conditions, such as high blood pressure and diabetes, as told by your health care provider. This is important.  How do I know that I am pregnant? You may be pregnant if you have been sexually active and you miss your period. Symptoms of early pregnancy include:  Mild cramping.  Very light vaginal bleeding (spotting).  Feeling unusually tired.  Nausea and vomiting (morning sickness).  If you have any of these symptoms and you suspect that you might be pregnant, you can take a home pregnancy test. These tests check for a hormone in your urine (human chorionic gonadotropin, or hCG). A woman's body begins to make this hormone during early pregnancy. These tests are very accurate. Wait until at least the first day after you miss your period to take one. If the test shows that you are pregnant (you get a positive  result), call your health care provider to make an appointment for prenatal care. What should I do if I become pregnant?  Make an appointment with your health care provider as soon as you suspect you are pregnant.  Do not use any products that contain nicotine, such as cigarettes, chewing tobacco, and e-cigarettes. If you need help quitting, ask your health care provider.  Do not drink alcoholic beverages. Alcohol is related to a number of birth defects.  Avoid toxic odors and chemicals.  You may continue to have sexual intercourse if it does not cause pain or other problems, such as vaginal bleeding. This information is not intended to replace advice given to you by your health care provider. Make sure you discuss any questions you have with your health care provider. Document Released: 02/23/2008 Document Revised:  11/08/2015 Document Reviewed: 10/02/2015 Elsevier Interactive Patient Education  2018 [employer]. Dysmenorrhea Menstrual cramps (dysmenorrhea) are caused by the muscles of the uterus tightening (contracting) during a menstrual period. For some women, this discomfort is merely bothersome. For others, dysmenorrhea can be severe enough to interfere with everyday activities for a few days each month. Primary dysmenorrhea is menstrual cramps that last a couple of days when you start having menstrual periods or soon after. This often begins after a teenager starts having her period. As a woman gets older or has a baby, the cramps will usually lessen or disappear. Secondary dysmenorrhea begins later in life, lasts longer, and the pain may be stronger than primary dysmenorrhea. The pain may start before the period and last a few days after the period. What are the causes? Dysmenorrhea is usually caused by an underlying problem, such as:  The tissue lining the uterus grows outside of the uterus in other areas of the body (endometriosis).  The endometrial tissue, which normally lines the  uterus, is found in or grows into the muscular walls of the uterus (adenomyosis).  The pelvic blood vessels are engorged with blood just before the menstrual period (pelvic congestive syndrome).  Overgrowth of cells (polyps) in the lining of the uterus or cervix.  Falling down of the uterus (prolapse) because of loose or stretched ligaments.  Depression.  Bladder problems, infection, or inflammation.  Problems with the intestine, a tumor, or irritable bowel syndrome.  Cancer of the female organs or bladder.  A severely tipped uterus.  A very tight opening or closed cervix.  Noncancerous tumors of the uterus (fibroids).  Pelvic inflammatory disease (PID).  Pelvic scarring (adhesions) from a previous surgery.  Ovarian cyst.  An intrauterine device (IUD) used for birth control.  What increases the risk? You may be at greater risk of dysmenorrhea if:  You are younger than age 22.  You started puberty early.  You have irregular or heavy bleeding.  You have never given birth.  You have a family history of this problem.  You are a smoker.  What are the signs or symptoms?  Cramping or throbbing pain in your lower abdomen.  Headaches.  Lower back pain.  Nausea or vomiting.  Diarrhea.  Sweating or dizziness.  Loose stools. How is this diagnosed? A diagnosis is based on your history, symptoms, physical exam, diagnostic tests, or procedures. Diagnostic tests or procedures may include:  Blood tests.  Ultrasonography.  An examination of the lining of the uterus (dilation and curettage, D&C).  An examination inside your abdomen or pelvis with a scope (laparoscopy).  X-rays.  CT scan.  MRI.  An examination inside the bladder with a scope (cystoscopy).  An examination inside the intestine or stomach with a scope (colonoscopy, gastroscopy).  How is this treated? Treatment depends on the cause of the dysmenorrhea. Treatment may include:  Pain medicine  prescribed by your health care provider.  Birth control pills or an IUD with progesterone hormone in it.  Hormone replacement therapy.  Nonsteroidal anti-inflammatory drugs (NSAIDs). These may help stop the production of prostaglandins.  Surgery to remove adhesions, endometriosis, ovarian cyst, or fibroids.  Removal of the uterus (hysterectomy).  Progesterone shots to stop the menstrual period.  Cutting the nerves on the sacrum that go to the female organs (presacral neurectomy).  Electric current to the sacral nerves (sacral nerve stimulation).  Antidepressant medicine.  Psychiatric therapy, counseling, or group therapy.  Exercise and physical therapy.  Meditation and yoga therapy.  Acupuncture.  Follow these instructions at home:  Only take over-the-counter or prescription medicines as directed by your health care provider.  Place a heating pad or hot water bottle on your lower back or abdomen. Do not sleep with the heating pad.  Use aerobic exercises, walking, swimming, biking, and other exercises to help lessen the cramping.  Massage to the lower back or abdomen may help.  Stop smoking.  Avoid alcohol and caffeine. Contact a health care provider if:  Your pain does not get better with medicine.  You have pain with sexual intercourse.  Your pain increases and is not controlled with medicines.  You have abnormal vaginal bleeding with your period.  You develop nausea or vomiting with your period that is not controlled with medicine. Get help right away if: You pass out. This information is not intended to replace advice given to you by your health care provider. Make sure you discuss any questions you have with your health care provider. Document Released: 03/12/2005 Document Revised: 08/18/2015 Document Reviewed: 08/28/2012 Elsevier Interactive Patient Education  2017 [employer].

## 2017-12-26 NOTE — Progress Notes (Signed)
RGYN pt w/ complaints of painful cramps w/ cycles..  Pt states she has long periods 7-8 days. Pt states last period was very painful  Pt also notes she has pain with intercourse.  No recent U/S. Pt wears tampons (super plus) changes every 2 hrs maybe sooner.  Pt states her breast have been leaking clear fluid. X 4 months no pain , no blood, no redness at breast.   Last pap: 11/02/2016 WNL  +CT on 11/02/2016  LMP: 12/20/2017  Contraception:None  STD Screening:Full panel   Pt states she has an appt w/ fertility office in November.

## 2017-12-26 NOTE — Progress Notes (Signed)
Subjective:        Susan Faulkner is a 37 y.o. female here for a routine exam.  Current complaints: Severe menstrual cramps and heavy 7-8 day periods.  Also has painful intercourse with deep thrusts.  Contraception: None.  Trying to conceive.  She has has 2 ectopic pregnancies, and has had bilateral salpingectomies.  Personal health questionnaire:  Is patient [name], have a family history of breast and/or ovarian cancer: no Is there a family history of uterine cancer diagnosed at age < 13, gastrointestinal cancer, urinary tract cancer, family member who is a Personnel officer syndrome-associated carrier: no Is the patient overweight and hypertensive, family history of diabetes, personal history of gestational diabetes, preeclampsia or PCOS: no Is patient over 28, have PCOS,  family history of premature CHD under age 47, diabetes, smoke, have hypertension or peripheral artery disease:  no At any time, has a partner hit, kicked or otherwise hurt or frightened you?: no Over the past 2 weeks, have you felt down, depressed or hopeless?: no Over the past 2 weeks, have you felt little interest or pleasure in doing things?:no   Gynecologic History Patient's last menstrual period was 12/20/2017 (exact date). Contraception: none Last Pap: 2018. Results were: normal Last mammogram: n/a. Results were: n/a  Obstetric History OB History  Gravida Para Term Preterm AB Living  2       2 0  SAB TAB Ectopic Multiple Live Births      2        # Outcome Date GA Lbr Len/2nd Weight Sex Delivery Anes PTL Lv  2 Ectopic 11/2011          1 Ectopic 04/2011            Past Medical History:  Diagnosis Date  . Asthma   . Ectopic pregnancy 2013   2    Past Surgical History:  Procedure Laterality Date  . SALPINGECTOMY       Current Outpatient Medications:  .  albuterol (PROVENTIL HFA;VENTOLIN HFA) 108 (90 BASE) MCG/ACT inhaler, Inhale 2 puffs into the lungs every 4 (four) hours as needed for  wheezing or shortness of breath., Disp: , Rfl:  .  Cholecalciferol (VITAMIN D3) 50000 units CAPS, TAKE 1 CAPSULE BY MOUTH ONCE A WEEK FOR 12 WEEKS, Disp: , Rfl: 0 .  azithromycin (ZITHROMAX) 500 MG tablet, Take 2 tablets (1,000 mg total) by mouth daily. (Patient not taking: Reported on 12/26/2017), Disp: 2 tablet, Rfl: 0 .  Cefixime (SUPRAX) 400 MG CAPS capsule, Take 1 capsule (400 mg total) by mouth daily. (Patient not taking: Reported on 12/26/2017), Disp: 1 capsule, Rfl: 0 .  cyclobenzaprine (FLEXERIL) 10 MG tablet, Take 1 tablet (10 mg total) by mouth 2 (two) times daily as needed for muscle spasms. (Patient not taking: Reported on 11/02/2016), Disp: 20 tablet, Rfl: 0 .  dicyclomine (BENTYL) 20 MG tablet, Take 1 tablet (20 mg total) by mouth 2 (two) times daily. (Patient not taking: Reported on 11/02/2016), Disp: 20 tablet, Rfl: 0 .  ibuprofen (ADVIL,MOTRIN) 800 MG tablet, Take 1 tablet (800 mg total) by mouth every 8 (eight) hours as needed. (Patient not taking: Reported on 12/26/2017), Disp: 21 tablet, Rfl: 0 .  ibuprofen (ADVIL,MOTRIN) 800 MG tablet, Take 1 tablet (800 mg total) by mouth every 8 (eight) hours as needed., Disp: 30 tablet, Rfl: 5 .  metroNIDAZOLE (FLAGYL) 500 MG tablet, Take 1 tablet (500 mg total) by mouth 2 (two) times daily. (Patient not taking: Reported on  12/26/2017), Disp: 14 tablet, Rfl: 2 .  metroNIDAZOLE (FLAGYL) 500 MG tablet, Take 1 tablet (500 mg total) by mouth 2 (two) times daily. (Patient not taking: Reported on 12/26/2017), Disp: 14 tablet, Rfl: 2 .  ondansetron (ZOFRAN ODT) 4 MG disintegrating tablet, Take 1 tablet (4 mg total) by mouth every 8 (eight) hours as needed for nausea or vomiting. (Patient not taking: Reported on 11/02/2016), Disp: 20 tablet, Rfl: 0 .  oxyCODONE-acetaminophen (PERCOCET/ROXICET) 5-325 MG tablet, Take 1 tablet by mouth every 4 (four) hours as needed for severe pain. (Patient not taking: Reported on 11/02/2016), Disp: 6 tablet, Rfl: 0 .  prenatal  vitamin w/FE, FA (PRENATAL 1 + 1) 27-1 MG TABS tablet, Take 1 tablet by mouth daily before breakfast., Disp: 30 each, Rfl: 11 Allergies  Allergen Reactions  . Shellfish Allergy Anaphylaxis and Swelling  . Iodine Other (See Comments)    No known reaction - was told to say she's allergic due to the reaction with shellfish     Social History   Tobacco Use  . Smoking status: Former Games developer  . Smokeless tobacco: Never Used  Substance Use Topics  . Alcohol use: Yes    Comment: occ    Family History  Problem Relation Age of Onset  . Hypertension Mother   . Diabetes Mother   . Hypertension Father       Review of Systems  Constitutional: negative for fatigue and weight loss Respiratory: negative for cough and wheezing Cardiovascular: negative for chest pain, fatigue and palpitations Gastrointestinal: negative for abdominal pain and change in bowel habits Musculoskeletal:negative for myalgias Neurological: negative for gait problems and tremors Behavioral/Psych: negative for abusive relationship, depression Endocrine: negative for temperature intolerance    Genitourinary:positive for abnormal 7-8 day menstrual periods with severe cramping and heavy bleeding.  Painful intercourse. Integument/breast: negative for breast lump, breast tenderness, nipple discharge and skin lesion(s)    Objective:       BP 114/72   Pulse 75   Wt 289 lb (131.1 kg)   LMP 12/20/2017 (Exact Date)   BMI 48.09 kg/m  General:   alert  Skin:   no rash or abnormalities  Lungs:   clear to auscultation bilaterally  Heart:   regular rate and rhythm, S1, S2 normal, no murmur, click, rub or gallop  Breasts:   normal without suspicious masses, skin or nipple changes or axillary nodes  Abdomen:  normal findings: no organomegaly, soft, non-tender and no hernia  Pelvis:  External genitalia: normal general appearance Urinary system: urethral meatus normal and bladder without fullness, nontender Vaginal: normal  without tenderness, induration or masses Cervix: normal appearance Adnexa: normal bimanual exam Uterus: anteverted and non-tender, normal size   Lab Review Urine pregnancy test Labs reviewed yes Radiologic studies reviewed no  50% of 20 min visit spent on counseling and coordination of care.   Assessment:     1. Encounter for annual routine gynecological examination  2. Screening for cervical cancer Rx: - Cytology - PAP  3. Vaginal discharge Rx: - Cervicovaginal ancillary only  4. Screening examination for STD (sexually transmitted disease) Rx: - Hepatitis B surface antigen - Hepatitis C antibody - RPR - HIV Antibody (routine testing w rflx)  5. Severe dysmenorrhea Rx: - ibuprofen (ADVIL,MOTRIN) 800 MG tablet; Take 1 tablet (800 mg total) by mouth every 8 (eight) hours as needed.  Dispense: 30 tablet; Refill: 5  6. Deep dyspareunia  7. Abnormal uterine bleeding (AUB) Rx: - US PELVIC COMPLETE WITH TRANSVAGINAL; Future  8. Class 3 severe obesity due to excess calories without serious comorbidity with body mass index (BMI) of 45.0 to 49.9 in adult [facility]) - program of caloric reduction, exercise and behavioral modification recommended  9. Encounter for preconception consultation Rx: - prenatal vitamin w/FE, FA (PRENATAL 1 + 1) 27-1 MG TABS tablet; Take 1 tablet by mouth daily before breakfast.  Dispense: 30 each; Refill: 11    Plan:    Education reviewed: calcium supplements, depression evaluation, low fat, low cholesterol diet, safe sex/STD prevention, self breast exams and weight bearing exercise. Follow up in: 1 year.   Meds ordered this encounter  Medications  . ibuprofen (ADVIL,MOTRIN) 800 MG tablet    Sig: Take 1 tablet (800 mg total) by mouth every 8 (eight) hours as needed.    Dispense:  30 tablet    Refill:  5  . prenatal vitamin w/FE, FA (PRENATAL 1 + 1) 27-1 MG TABS tablet    Sig: Take 1 tablet by mouth daily before breakfast.    Dispense:  30 each     Refill:  11   Orders Placed This Encounter  Procedures  . US PELVIC COMPLETE WITH TRANSVAGINAL    Wt 289/No Needs/Ins-[employer] Scheduled with [facility] at [facility] fax order and give prep instructions/location KG     Standing Status:   Future    Standing Expiration Date:   02/26/2019    Order Specific Question:   Reason for Exam (SYMPTOM  OR DIAGNOSIS REQUIRED)    Answer:   Severe dysmenorrhea.  Pelvic pain.  AUB    Order Specific Question:   Preferred imaging location?    Answer:   [facility] [facility]  . Hepatitis B surface antigen  . Hepatitis C antibody  . RPR  . HIV Antibody (routine testing w rflx)     Brock Bad MD 12-26-2017

## 2017-12-27 LAB — CERVICOVAGINAL ANCILLARY ONLY
Bacterial vaginitis: NEGATIVE
CHLAMYDIA, DNA PROBE: NEGATIVE
Candida vaginitis: NEGATIVE
NEISSERIA GONORRHEA: NEGATIVE
TRICH (WINDOWPATH): NEGATIVE

## 2017-12-27 LAB — HIV ANTIBODY (ROUTINE TESTING W REFLEX): HIV Screen 4th Generation wRfx: NONREACTIVE

## 2017-12-27 LAB — HEPATITIS B SURFACE ANTIGEN: Hepatitis B Surface Ag: NEGATIVE

## 2017-12-27 LAB — RPR: RPR: NONREACTIVE

## 2017-12-27 LAB — HEPATITIS C ANTIBODY: Hep C Virus Ab: <0.1 s/co ratio (ref 0.0–0.9)

## 2017-12-30 LAB — CYTOLOGY - PAP
Diagnosis: NEGATIVE
HPV: NOT DETECTED

## 2018-01-01 ENCOUNTER — Ambulatory Visit
Admission: RE | Admit: 2018-01-01 | Discharge: 2018-01-01 | Disposition: A | Discharge status: Home / Self Care | Payer: BLUE CROSS/BLUE SHIELD | Source: Ambulatory Visit | Attending: Obstetrics | Admitting: Obstetrics

## 2018-01-01 DIAGNOSIS — N939 Abnormal uterine and vaginal bleeding, unspecified: Secondary | ICD-10-CM

## 2018-01-08 DIAGNOSIS — R0681 Apnea, not elsewhere classified: Secondary | ICD-10-CM | POA: Diagnosis not present

## 2018-01-08 DIAGNOSIS — R0683 Snoring: Secondary | ICD-10-CM | POA: Diagnosis not present

## 2018-01-16 DIAGNOSIS — Z319 Encounter for procreative management, unspecified: Secondary | ICD-10-CM | POA: Diagnosis not present

## 2018-01-16 DIAGNOSIS — E288 Other ovarian dysfunction: Secondary | ICD-10-CM | POA: Diagnosis not present

## 2018-01-24 DIAGNOSIS — Z319 Encounter for procreative management, unspecified: Secondary | ICD-10-CM | POA: Diagnosis not present

## 2018-01-24 DIAGNOSIS — E288 Other ovarian dysfunction: Secondary | ICD-10-CM | POA: Diagnosis not present

## 2018-04-27 ENCOUNTER — Encounter (HOSPITAL_COMMUNITY): Payer: Self-pay

## 2018-04-27 ENCOUNTER — Emergency Department (HOSPITAL_COMMUNITY): Admission: EM | Admit: 2018-04-27 | Discharge: 2018-04-27 | Discharge status: Absent without leave | Payer: Self-pay

## 2018-04-27 ENCOUNTER — Emergency Department (HOSPITAL_COMMUNITY)
Admission: EM | Admit: 2018-04-27 | Discharge: 2018-04-28 | Disposition: A | Discharge status: Home / Self Care | Payer: 59 | Attending: Emergency Medicine | Admitting: Emergency Medicine

## 2018-04-27 DIAGNOSIS — Z87891 Personal history of nicotine dependence: Secondary | ICD-10-CM | POA: Insufficient documentation

## 2018-04-27 DIAGNOSIS — N83291 Other ovarian cyst, right side: Secondary | ICD-10-CM | POA: Diagnosis not present

## 2018-04-27 DIAGNOSIS — R102 Pelvic and perineal pain: Secondary | ICD-10-CM | POA: Diagnosis not present

## 2018-04-27 DIAGNOSIS — N83201 Unspecified ovarian cyst, right side: Secondary | ICD-10-CM | POA: Insufficient documentation

## 2018-04-27 DIAGNOSIS — Z79899 Other long term (current) drug therapy: Secondary | ICD-10-CM | POA: Diagnosis not present

## 2018-04-27 DIAGNOSIS — R52 Pain, unspecified: Secondary | ICD-10-CM

## 2018-04-27 DIAGNOSIS — J45909 Unspecified asthma, uncomplicated: Secondary | ICD-10-CM | POA: Insufficient documentation

## 2018-04-27 LAB — CBC
HCT: 38.3 % (ref 36.0–46.0)
HEMOGLOBIN: 11.7 g/dL — AB (ref 12.0–15.0)
MCH: 28.2 pg (ref 26.0–34.0)
MCHC: 30.5 g/dL (ref 30.0–36.0)
MCV: 92.3 fL (ref 80.0–100.0)
PLATELETS: 253 10*3/uL (ref 150–400)
RBC: 4.15 MIL/uL (ref 3.87–5.11)
RDW: 12.3 % (ref 11.5–15.5)
WBC: 8.2 10*3/uL (ref 4.0–10.5)
nRBC: 0 % (ref 0.0–0.2)

## 2018-04-27 LAB — URINALYSIS, ROUTINE W REFLEX MICROSCOPIC
Bilirubin Urine: NEGATIVE
GLUCOSE, UA: 50 mg/dL — AB
Ketones, ur: NEGATIVE mg/dL
Leukocytes, UA: NEGATIVE
NITRITE: NEGATIVE
PROTEIN: NEGATIVE mg/dL
Specific Gravity, Urine: 1.026 (ref 1.005–1.030)
pH: 6 (ref 5.0–8.0)

## 2018-04-27 MED ORDER — SODIUM CHLORIDE 0.9% FLUSH
3.0000 mL | Freq: Once | INTRAVENOUS | Status: AC
  Administered 2018-04-27: 3 mL via INTRAVENOUS

## 2018-04-27 NOTE — ED Triage Notes (Signed)
Pt reports lower abdominal pain that radiates to her rectum that started 3 nights ago. She states that she has had 2 ectopic pregnancies in the past and this pain feels similar. Denies nausea. States that she started bleeding today.

## 2018-04-27 NOTE — ED Provider Notes (Signed)
[facility]-EMERGENCY DEPT Provider Note   CSN: [id number] Arrival date & time: 04/27/18  2248     History   Chief Complaint Chief Complaint  Patient presents with  . Abdominal Pain    HPI Susan Faulkner is a 38 y.o. female.  The history is provided by the patient and medical records.  Abdominal Pain  Associated symptoms: vaginal bleeding     38 y.o. F with hx of asthma, ectopic pregnancy x2, presenting to the ED for abdominal pain.  Patient reports this has been ongoing for a few days.  Pain localized to right pelvis with some radiation to her rectum.  States pain comes and goes but sharp in nature, almost like knives stabbing her.  States tonight she started having some vaginal bleeding-- states it is light, noticeable with wiping after urination.  Just had normal period last week.  No vaginal discharge.  No urinary symptoms.  States pain feels like when she had prior ectopic pregnancies.  She is not currently on OCP's.  Has not taken home pregnancy test.  Past Medical History:  Diagnosis Date  . Asthma   . Ectopic pregnancy 2013   2    There are no active problems to display for this patient.   Past Surgical History:  Procedure Laterality Date  . SALPINGECTOMY       OB History    Gravida  2   Para      Term      Preterm      AB  2   Living  0     SAB      TAB      Ectopic  2   Multiple      Live Births               Home Medications    Prior to Admission medications   Medication Sig Start Date End Date Taking? Authorizing Provider  albuterol (PROVENTIL HFA;VENTOLIN HFA) 108 (90 BASE) MCG/ACT inhaler Inhale 2 puffs into the lungs every 4 (four) hours as needed for wheezing or shortness of breath.    [provider]  azithromycin (ZITHROMAX) 500 MG tablet Take 2 tablets (1,000 mg total) by mouth daily. Patient not taking: Reported on 12/26/2017 11/07/16   Brock Bad, MD  Cefixime (SUPRAX) 400 MG CAPS  capsule Take 1 capsule (400 mg total) by mouth daily. Patient not taking: Reported on 12/26/2017 11/07/16   Brock Bad, MD  Cholecalciferol (VITAMIN D3) 50000 units CAPS TAKE 1 CAPSULE BY MOUTH ONCE A WEEK FOR 12 WEEKS 11/11/17   [provider]  cyclobenzaprine (FLEXERIL) 10 MG tablet Take 1 tablet (10 mg total) by mouth 2 (two) times daily as needed for muscle spasms. Patient not taking: Reported on 11/02/2016 04/24/16   Elpidio Anis, PA-C  dicyclomine (BENTYL) 20 MG tablet Take 1 tablet (20 mg total) by mouth 2 (two) times daily. Patient not taking: Reported on 11/02/2016 08/24/15   Sam, Ace Gins, PA-C  ibuprofen (ADVIL,MOTRIN) 800 MG tablet Take 1 tablet (800 mg total) by mouth every 8 (eight) hours as needed. Patient not taking: Reported on 12/26/2017 10/24/16   Ward, Chase Picket, PA-C  ibuprofen (ADVIL,MOTRIN) 800 MG tablet Take 1 tablet (800 mg total) by mouth every 8 (eight) hours as needed. 12/26/17   Brock Bad, MD  metroNIDAZOLE (FLAGYL) 500 MG tablet Take 1 tablet (500 mg total) by mouth 2 (two) times daily. Patient not taking: Reported on 12/26/2017  11/02/16   Brock BadHarper, Charles A, MD  metroNIDAZOLE (FLAGYL) 500 MG tablet Take 1 tablet (500 mg total) by mouth 2 (two) times daily. Patient not taking: Reported on 12/26/2017 11/07/16   Brock BadHarper, Charles A, MD  ondansetron (ZOFRAN ODT) 4 MG disintegrating tablet Take 1 tablet (4 mg total) by mouth every 8 (eight) hours as needed for nausea or vomiting. Patient not taking: Reported on 11/02/2016 08/24/15   Carlene CoriaSam, Serena Y, PA-C  oxyCODONE-acetaminophen (PERCOCET/ROXICET) 5-325 MG tablet Take 1 tablet by mouth every 4 (four) hours as needed for severe pain. Patient not taking: Reported on 11/02/2016 04/24/16   Elpidio AnisUpstill, Shari, PA-C  prenatal vitamin w/FE, FA (PRENATAL 1 + 1) 27-1 MG TABS tablet Take 1 tablet by mouth daily before breakfast. 12/26/17   Brock BadHarper, Charles A, MD    Family History Family History  Problem Relation Age of Onset    . Hypertension Mother   . Diabetes Mother   . Hypertension Father     Social History Social History   Tobacco Use  . Smoking status: Former Games developermoker  . Smokeless tobacco: Never Used  Substance Use Topics  . Alcohol use: Yes    Comment: occ  . Drug use: No     Allergies   Shellfish allergy and Iodine   Review of Systems Review of Systems  Gastrointestinal: Positive for abdominal pain.  Genitourinary: Positive for vaginal bleeding.  All other systems reviewed and are negative.    Physical Exam Updated Vital Signs BP (!) 154/101 (BP Location: Right Arm)   Pulse 100   Temp 98.5 F (36.9 C) (Oral)   Resp 18   SpO2 95%   Physical Exam Vitals signs and nursing note reviewed.  Constitutional:      Appearance: She is well-developed.  HENT:     Head: Normocephalic and atraumatic.  Eyes:     Conjunctiva/sclera: Conjunctivae normal.     Pupils: Pupils are equal, round, and reactive to light.  Neck:     Musculoskeletal: Normal range of motion.  Cardiovascular:     Rate and Rhythm: Normal rate and regular rhythm.     Heart sounds: Normal heart sounds.  Pulmonary:     Effort: Pulmonary effort is normal.     Breath sounds: Normal breath sounds.  Abdominal:     General: Bowel sounds are normal.     Palpations: Abdomen is soft.  Genitourinary:    Comments: Exam chaperoned by RN Normal female external genitalia without noted lesion or rash; scant amount of clear/yellow vaginal discharge with small amount of bleeding noted; cervical os closed; tenderness throughout pelvis with bimanual exam, worse on right; no masses or fullness appreciated Musculoskeletal: Normal range of motion.  Skin:    General: Skin is warm and dry.  Neurological:     Mental Status: She is alert and oriented to person, place, and time.      ED Treatments / Results  Labs (all labs ordered are listed, but only abnormal results are displayed) Labs Reviewed  LIPASE, BLOOD - Abnormal; Notable  for the following components:      Result Value   Lipase 56 (*)    All other components within normal limits  COMPREHENSIVE METABOLIC PANEL - Abnormal; Notable for the following components:   Glucose, Bld 116 (*)    All other components within normal limits  CBC - Abnormal; Notable for the following components:   Hemoglobin 11.7 (*)    All other components within normal limits  URINALYSIS, ROUTINE W  REFLEX MICROSCOPIC - Abnormal; Notable for the following components:   APPearance HAZY (*)    Glucose, UA 50 (*)    Hgb urine dipstick LARGE (*)    Bacteria, UA RARE (*)    All other components within normal limits  WET PREP, GENITAL  PREGNANCY, URINE  GC/CHLAMYDIA PROBE AMP ([place]) NOT AT [facility]    EKG None  Radiology US Pelvis Transvanginal Non-ob (tv Only)  Result Date: 04/28/2018 CLINICAL DATA:  Pelvic pain. EXAM: TRANSABDOMINAL AND TRANSVAGINAL ULTRASOUND OF PELVIS DOPPLER ULTRASOUND OF OVARIES TECHNIQUE: Both transabdominal and transvaginal ultrasound examinations of the pelvis were performed. Transabdominal technique was performed for global imaging of the pelvis including uterus, ovaries, adnexal regions, and pelvic cul-de-sac. It was necessary to proceed with endovaginal exam following the transabdominal exam to visualize the endometrium and ovaries. Color and duplex Doppler ultrasound was utilized to evaluate blood flow to the ovaries. COMPARISON:  None. FINDINGS: Uterus Measurements: 8.2 x 3.7 x 4.4 cm. No fibroids or other mass visualized. Endometrium Thickness: 10 mm.  No focal abnormality visualized. Right ovary Measurements: 4.1 x 3.4 x 3.5 cm = volume: 25 mL. 3.2 x 3 x 2.8 cm anechoic right ovarian mass most consistent with a small cyst. Left ovary Measurements: 2 x 2.4 x 2.3 cm = volume: 5.8 mL. Normal appearance/no adnexal mass. Pulsed Doppler evaluation of both ovaries demonstrates normal low-resistance arterial and venous waveforms. Other findings Trace pelvic free fluid  likely physiologic. IMPRESSION: 1. No ovarian torsion. 2. Simple right ovarian cyst. Electronically Signed   By: Elige Ko   On: 04/28/2018 02:48   US Pelvic Doppler (torsion R/o Or Mass Arterial Flow)  Result Date: 04/28/2018 CLINICAL DATA:  Pelvic pain. EXAM: TRANSABDOMINAL AND TRANSVAGINAL ULTRASOUND OF PELVIS DOPPLER ULTRASOUND OF OVARIES TECHNIQUE: Both transabdominal and transvaginal ultrasound examinations of the pelvis were performed. Transabdominal technique was performed for global imaging of the pelvis including uterus, ovaries, adnexal regions, and pelvic cul-de-sac. It was necessary to proceed with endovaginal exam following the transabdominal exam to visualize the endometrium and ovaries. Color and duplex Doppler ultrasound was utilized to evaluate blood flow to the ovaries. COMPARISON:  None. FINDINGS: Uterus Measurements: 8.2 x 3.7 x 4.4 cm. No fibroids or other mass visualized. Endometrium Thickness: 10 mm.  No focal abnormality visualized. Right ovary Measurements: 4.1 x 3.4 x 3.5 cm = volume: 25 mL. 3.2 x 3 x 2.8 cm anechoic right ovarian mass most consistent with a small cyst. Left ovary Measurements: 2 x 2.4 x 2.3 cm = volume: 5.8 mL. Normal appearance/no adnexal mass. Pulsed Doppler evaluation of both ovaries demonstrates normal low-resistance arterial and venous waveforms. Other findings Trace pelvic free fluid likely physiologic. IMPRESSION: 1. No ovarian torsion. 2. Simple right ovarian cyst. Electronically Signed   By: Elige Ko   On: 04/28/2018 02:48   US Pelvic Complete W Transvaginal And Torsion R/o  Result Date: 04/28/2018 CLINICAL DATA:  Pelvic pain. EXAM: TRANSABDOMINAL AND TRANSVAGINAL ULTRASOUND OF PELVIS DOPPLER ULTRASOUND OF OVARIES TECHNIQUE: Both transabdominal and transvaginal ultrasound examinations of the pelvis were performed. Transabdominal technique was performed for global imaging of the pelvis including uterus, ovaries, adnexal regions, and pelvic cul-de-sac.  It was necessary to proceed with endovaginal exam following the transabdominal exam to visualize the endometrium and ovaries. Color and duplex Doppler ultrasound was utilized to evaluate blood flow to the ovaries. COMPARISON:  None. FINDINGS: Uterus Measurements: 8.2 x 3.7 x 4.4 cm. No fibroids or other mass visualized. Endometrium Thickness: 10 mm.  No focal abnormality visualized. Right ovary Measurements: 4.1 x 3.4 x 3.5 cm = volume: 25 mL. 3.2 x 3 x 2.8 cm anechoic right ovarian mass most consistent with a small cyst. Left ovary Measurements: 2 x 2.4 x 2.3 cm = volume: 5.8 mL. Normal appearance/no adnexal mass. Pulsed Doppler evaluation of both ovaries demonstrates normal low-resistance arterial and venous waveforms. Other findings Trace pelvic free fluid likely physiologic. IMPRESSION: 1. No ovarian torsion. 2. Simple right ovarian cyst. Electronically Signed   By: Elige KoHetal  Patel   On: 04/28/2018 02:48    Procedures Procedures (including critical care time)  Medications Ordered in ED Medications  sodium chloride flush (NS) 0.9 % injection 3 mL (3 mLs Intravenous Given 04/27/18 2336)  oxyCODONE-acetaminophen (PERCOCET/ROXICET) 5-325 MG per tablet 2 tablet (2 tablets Oral Given 04/28/18 0134)     Initial Impression / Assessment and Plan / ED Course  I have reviewed the triage vital signs and the nursing notes.  Pertinent labs & imaging results that were available during my care of the patient were reviewed by me and considered in my medical decision making (see chart for details).  38 year old female here with right-sided pelvic pain radiating to her rectum.  States similar symptoms in the past with ectopic pregnancy x2.  She is afebrile, non-toxic.  Abdomen overall soft and benign.  Labs overall reassuring.  Pregnancy is negative.  Pelvic exam performed, scant amount of clear/yellow discharge and small amount of bleeding.  Cervical os closed.  Tenderness throughout pelvis on bimanual, worse on the  right.  No appreciable mass or fullness.  Ultrasound obtained with findings of simple right ovarian cyst without complicating features.  No evidence of torsion.  Results discussed with patient.  After some oral pain medication she is currently pain-free.  Feel she is stable for discharge home with continued symptomatic care.  We will have her follow-up closely with her OB/GYN.  She can return here for any new/acute changes.  Final Clinical Impressions(s) / ED Diagnoses   Final diagnoses:  Pelvic pain  Cyst of right ovary    ED Discharge Orders         Ordered    HYDROcodone-acetaminophen (NORCO/VICODIN) 5-325 MG tablet  Every 4 hours PRN     04/28/18 0314    ibuprofen (ADVIL,MOTRIN) 800 MG tablet  3 times daily     04/28/18 0314           Garlon HatchetSanders, Telsa Dillavou M, PA-C 04/28/18 [id number]    Shaune PollackIsaacs, Cameron, MD 04/29/18 1630

## 2018-04-28 ENCOUNTER — Telehealth (HOSPITAL_COMMUNITY): Payer: Self-pay | Admitting: Emergency Medicine

## 2018-04-28 ENCOUNTER — Emergency Department (HOSPITAL_COMMUNITY): Payer: 59

## 2018-04-28 DIAGNOSIS — N83291 Other ovarian cyst, right side: Secondary | ICD-10-CM | POA: Diagnosis not present

## 2018-04-28 LAB — COMPREHENSIVE METABOLIC PANEL
ALBUMIN: 3.5 g/dL (ref 3.5–5.0)
ALK PHOS: 39 U/L (ref 38–126)
ALT: 20 U/L (ref 0–44)
AST: 19 U/L (ref 15–41)
Anion gap: 8 (ref 5–15)
BILIRUBIN TOTAL: 0.4 mg/dL (ref 0.3–1.2)
BUN: 18 mg/dL (ref 6–20)
CALCIUM: 8.9 mg/dL (ref 8.9–10.3)
CO2: 22 mmol/L (ref 22–32)
CREATININE: 0.81 mg/dL (ref 0.44–1.00)
Chloride: 108 mmol/L (ref 98–111)
GFR calc Af Amer: >60 mL/min (ref >60)
GFR calc non Af Amer: >60 mL/min (ref >60)
GLUCOSE: 116 mg/dL — AB (ref 70–99)
Potassium: 4.3 mmol/L (ref 3.5–5.1)
Sodium: 138 mmol/L (ref 135–145)
TOTAL PROTEIN: 7.1 g/dL (ref 6.5–8.1)

## 2018-04-28 LAB — WET PREP, GENITAL
Clue Cells Wet Prep HPF POC: NONE SEEN
Sperm: NONE SEEN
Trich, Wet Prep: NONE SEEN
WBC WET PREP: NONE SEEN
Yeast Wet Prep HPF POC: NONE SEEN

## 2018-04-28 LAB — LIPASE, BLOOD: Lipase: 56 U/L — ABNORMAL HIGH (ref 11–51)

## 2018-04-28 LAB — GC/CHLAMYDIA PROBE AMP (~~LOC~~) NOT AT ARMC
Chlamydia: NEGATIVE
NEISSERIA GONORRHEA: NEGATIVE

## 2018-04-28 LAB — PREGNANCY, URINE: PREG TEST UR: NEGATIVE

## 2018-04-28 MED ORDER — OXYCODONE-ACETAMINOPHEN 5-325 MG PO TABS
2.0000 | ORAL_TABLET | Freq: Once | ORAL | Status: AC
  Administered 2018-04-28: 2 via ORAL
  Filled 2018-04-28: qty 2

## 2018-04-28 MED ORDER — HYDROCODONE-ACETAMINOPHEN 5-325 MG PO TABS: 1.0000 | ORAL_TABLET | ORAL | 0 refills | Status: DC | PRN

## 2018-04-28 MED ORDER — IBUPROFEN 800 MG PO TABS: 800.0000 mg | ORAL_TABLET | Freq: Three times a day (TID) | ORAL | 0 refills | Status: DC

## 2018-04-28 NOTE — ED Notes (Signed)
Opened chart to answer question regarding prescription. 

## 2018-04-28 NOTE — ED Notes (Signed)
Pt c/o lower abd pain, as well as rectal pain. Relays history of ectopic pregnancy around 7 years ago

## 2018-04-28 NOTE — ED Notes (Signed)
US at bedside

## 2018-04-28 NOTE — Telephone Encounter (Signed)
Patient's hydrocodone was not filled because the pharmacy would not fill the paper prescription.  I reordered it as an E prescription.

## 2018-04-28 NOTE — Discharge Instructions (Signed)
Take the prescribed medication as directed. °Follow-up with your OB-GYN °Return to the ED for new or worsening symptoms. ° °

## 2018-12-29 ENCOUNTER — Ambulatory Visit: Payer: 59 | Admitting: Obstetrics

## 2019-01-08 ENCOUNTER — Other Ambulatory Visit: Payer: Self-pay

## 2019-01-08 ENCOUNTER — Encounter: Payer: Self-pay | Admitting: Obstetrics

## 2019-01-08 ENCOUNTER — Ambulatory Visit (INDEPENDENT_AMBULATORY_CARE_PROVIDER_SITE_OTHER): Payer: 59 | Admitting: Obstetrics

## 2019-01-08 VITALS — BP 112/72 | HR 84 | Ht 65.0 in | Wt 308.8 lb

## 2019-01-08 DIAGNOSIS — Z124 Encounter for screening for malignant neoplasm of cervix: Secondary | ICD-10-CM | POA: Diagnosis not present

## 2019-01-08 DIAGNOSIS — Z1151 Encounter for screening for human papillomavirus (HPV): Secondary | ICD-10-CM

## 2019-01-08 DIAGNOSIS — N760 Acute vaginitis: Secondary | ICD-10-CM | POA: Diagnosis not present

## 2019-01-08 DIAGNOSIS — N898 Other specified noninflammatory disorders of vagina: Secondary | ICD-10-CM

## 2019-01-08 DIAGNOSIS — N3281 Overactive bladder: Secondary | ICD-10-CM

## 2019-01-08 DIAGNOSIS — Z01419 Encounter for gynecological examination (general) (routine) without abnormal findings: Secondary | ICD-10-CM

## 2019-01-08 DIAGNOSIS — Z113 Encounter for screening for infections with a predominantly sexual mode of transmission: Secondary | ICD-10-CM

## 2019-01-08 DIAGNOSIS — B9689 Other specified bacterial agents as the cause of diseases classified elsewhere: Secondary | ICD-10-CM | POA: Diagnosis not present

## 2019-01-08 DIAGNOSIS — E282 Polycystic ovarian syndrome: Secondary | ICD-10-CM

## 2019-01-08 MED ORDER — METFORMIN HCL ER 500 MG PO TB24: 1500.0000 mg | ORAL_TABLET | Freq: Every day | ORAL | 11 refills | Status: DC

## 2019-01-08 NOTE — Progress Notes (Signed)
Patient is in the office for annual. Last pap 12-26-17. Pt states that she has been urinating more frequently, and has issues holding urine when she feels the urge. GAD 7= 9

## 2019-01-08 NOTE — Progress Notes (Signed)
Subjective:        Susan EstersLetisha C Faulkner is a 38 y.o. female here for a routine exam.  Current complaints: Urinary frequency and urgency with urge incontinence.  Denies burning or pain with urination.    Personal health questionnaire:  Is patient [name], have a family history of breast and/or ovarian cancer: no Is there a family history of uterine cancer diagnosed at age < 450, gastrointestinal cancer, urinary tract cancer, family member who is a Personnel officerLynch syndrome-associated carrier: no Is the patient overweight and hypertensive, family history of diabetes, personal history of gestational diabetes, preeclampsia or PCOS: no Is patient over 4155, have PCOS,  family history of premature CHD under age 36, diabetes, smoke, have hypertension or peripheral artery disease:  no At any time, has a partner hit, kicked or otherwise hurt or frightened you?: no Over the past 2 weeks, have you felt down, depressed or hopeless?: no Over the past 2 weeks, have you felt little interest or pleasure in doing things?:no   Gynecologic History Patient's last menstrual period was 12/26/2018. Contraception: none Last Pap: 12-26-2017. Results were: normal Last mammogram: n/a. Results were: n/a  Obstetric History OB History  Gravida Para Term Preterm AB Living  2       2 0  SAB TAB Ectopic Multiple Live Births      2        # Outcome Date GA Lbr Len/2nd Weight Sex Delivery Anes PTL Lv  2 Ectopic 11/2011          1 Ectopic 04/2011            Past Medical History:  Diagnosis Date  . Asthma   . Ectopic pregnancy 2013   2    Past Surgical History:  Procedure Laterality Date  . SALPINGECTOMY       Current Outpatient Medications:  .  albuterol (PROVENTIL HFA;VENTOLIN HFA) 108 (90 BASE) MCG/ACT inhaler, Inhale 2 puffs into the lungs every 4 (four) hours as needed for wheezing or shortness of breath., Disp: , Rfl:  .  Cholecalciferol (VITAMIN D3) 50000 units CAPS, TAKE 1 CAPSULE BY MOUTH ONCE A WEEK  FOR 12 WEEKS, Disp: , Rfl: 0 .  azithromycin (ZITHROMAX) 500 MG tablet, Take 2 tablets (1,000 mg total) by mouth daily. (Patient not taking: Reported on 12/26/2017), Disp: 2 tablet, Rfl: 0 .  Cefixime (SUPRAX) 400 MG CAPS capsule, Take 1 capsule (400 mg total) by mouth daily. (Patient not taking: Reported on 12/26/2017), Disp: 1 capsule, Rfl: 0 .  cyclobenzaprine (FLEXERIL) 10 MG tablet, Take 1 tablet (10 mg total) by mouth 2 (two) times daily as needed for muscle spasms. (Patient not taking: Reported on 11/02/2016), Disp: 20 tablet, Rfl: 0 .  dicyclomine (BENTYL) 20 MG tablet, Take 1 tablet (20 mg total) by mouth 2 (two) times daily. (Patient not taking: Reported on 11/02/2016), Disp: 20 tablet, Rfl: 0 .  HYDROcodone-acetaminophen (NORCO/VICODIN) 5-325 MG tablet, Take 1 tablet by mouth every 4 (four) hours as needed. (Patient not taking: Reported on 01/08/2019), Disp: 10 tablet, Rfl: 0 .  ibuprofen (ADVIL,MOTRIN) 800 MG tablet, Take 1 tablet (800 mg total) by mouth 3 (three) times daily. (Patient not taking: Reported on 01/08/2019), Disp: 21 tablet, Rfl: 0 .  metFORMIN (GLUCOPHAGE XR) 500 MG 24 hr tablet, Take 3 tablets (1,500 mg total) by mouth daily after supper., Disp: 90 tablet, Rfl: 11 .  metroNIDAZOLE (FLAGYL) 500 MG tablet, Take 1 tablet (500 mg total) by mouth 2 (two) times  daily. (Patient not taking: Reported on 12/26/2017), Disp: 14 tablet, Rfl: 2 .  metroNIDAZOLE (FLAGYL) 500 MG tablet, Take 1 tablet (500 mg total) by mouth 2 (two) times daily. (Patient not taking: Reported on 12/26/2017), Disp: 14 tablet, Rfl: 2 .  ondansetron (ZOFRAN ODT) 4 MG disintegrating tablet, Take 1 tablet (4 mg total) by mouth every 8 (eight) hours as needed for nausea or vomiting. (Patient not taking: Reported on 11/02/2016), Disp: 20 tablet, Rfl: 0 .  oxyCODONE-acetaminophen (PERCOCET/ROXICET) 5-325 MG tablet, Take 1 tablet by mouth every 4 (four) hours as needed for severe pain. (Patient not taking: Reported on  11/02/2016), Disp: 6 tablet, Rfl: 0 .  prenatal vitamin w/FE, FA (PRENATAL 1 + 1) 27-1 MG TABS tablet, Take 1 tablet by mouth daily before breakfast. (Patient not taking: Reported on 01/08/2019), Disp: 30 each, Rfl: 11 Allergies  Allergen Reactions  . Shellfish Allergy Anaphylaxis and Swelling  . Iodine Other (See Comments)    No known reaction - was told to say she's allergic due to the reaction with shellfish     Social History   Tobacco Use  . Smoking status: Former Research scientist (life sciences)  . Smokeless tobacco: Never Used  Substance Use Topics  . Alcohol use: Not Currently    Comment: occ    Family History  Problem Relation Age of Onset  . Hypertension Mother   . Diabetes Mother   . Hypertension Father       Review of Systems  Constitutional: negative for fatigue and weight loss Respiratory: negative for cough and wheezing Cardiovascular: negative for chest pain, fatigue and palpitations Gastrointestinal: negative for abdominal pain and change in bowel habits Musculoskeletal:negative for myalgias Neurological: negative for gait problems and tremors Behavioral/Psych: negative for abusive relationship, depression Endocrine: negative for temperature intolerance    Genitourinary:negative for abnormal menstrual periods, genital lesions, hot flashes, sexual problems and vaginal discharge Integument/breast: negative for breast lump, breast tenderness, nipple discharge and skin lesion(s)    Objective:       BP 112/72   Pulse 84   Ht 5\' 5"  (1.651 m)   Wt (!) 308 lb 12.8 oz (140.1 kg)   LMP 12/26/2018   BMI 51.39 kg/m  General:   alert  Skin:   no rash or abnormalities  Lungs:   clear to auscultation bilaterally  Heart:   regular rate and rhythm, S1, S2 normal, no murmur, click, rub or gallop  Breasts:   normal without suspicious masses, skin or nipple changes or axillary nodes  Abdomen:  normal findings: no organomegaly, soft, non-tender and no hernia  Pelvis:  External genitalia:  normal general appearance Urinary system: urethral meatus normal and bladder without fullness, nontender Vaginal: normal without tenderness, induration or masses Cervix: normal appearance Adnexa: normal bimanual exam Uterus: anteverted and non-tender, normal size   Lab Review Urine pregnancy test Labs reviewed yes Radiologic studies reviewed yes  50% of 25 min visit spent on counseling and coordination of care.   Assessment:     1. Encounter for routine gynecological examination with Papanicolaou smear of cervix Rx: - Cytology - PAP( [place]) - Hemoglobin A1c  2. Vaginal discharge Rx: - Cervicovaginal ancillary only( [place])  3. PCOS (polycystic ovarian syndrome) Rx: - metFORMIN (GLUCOPHAGE XR) 500 MG 24 hr tablet; Take 3 tablets (1,500 mg total) by mouth daily after supper.  Dispense: 90 tablet; Refill: 11  4. OAB (overactive bladder) Rx: - Urine Culture - Ambulatory referral to Urology  5. Class 3 severe obesity  without serious comorbidity with body mass index (BMI) of 50.0 to 59.9 in adult, unspecified obesity type (HCC) - program of caloric reduction, exercise and behavioral modification recommended   Plan:    Education reviewed: calcium supplements, depression evaluation, low fat, low cholesterol diet, safe sex/STD prevention, self breast exams and weight bearing exercise. Contraception: none. Follow up in: 1 months.   Meds ordered this encounter  Medications  . metFORMIN (GLUCOPHAGE XR) 500 MG 24 hr tablet    Sig: Take 3 tablets (1,500 mg total) by mouth daily after supper.    Dispense:  90 tablet    Refill:  11   Orders Placed This Encounter  Procedures  . Urine Culture  . Hemoglobin A1c  . Ambulatory referral to Urology    Referral Priority:   Routine    Referral Type:   Consultation    Referral Reason:   Specialty Services Required    Requested Specialty:   Urology    Number of Visits Requested:   1    Brock Bad, MD 01/08/2019  11:02 AM

## 2019-01-09 LAB — POCT URINALYSIS DIPSTICK
Bilirubin, UA: NEGATIVE
Blood, UA: NEGATIVE
Glucose, UA: NEGATIVE
Ketones, UA: NEGATIVE
Leukocytes, UA: NEGATIVE
Nitrite, UA: NEGATIVE
Protein, UA: NEGATIVE
Spec Grav, UA: 1.02 (ref 1.010–1.025)
Urobilinogen, UA: 0.2 E.U./dL
pH, UA: 6 (ref 5.0–8.0)

## 2019-01-09 LAB — HEMOGLOBIN A1C
Est. average glucose Bld gHb Est-mCnc: 97 mg/dL
Hgb A1c MFr Bld: 5 % (ref 4.8–5.6)

## 2019-01-09 NOTE — Addendum Note (Signed)
Addended by: Tristan Schroeder D on: 01/09/2019 10:35 AM   Modules accepted: Orders

## 2019-01-10 LAB — URINE CULTURE

## 2019-01-13 ENCOUNTER — Other Ambulatory Visit: Payer: Self-pay | Admitting: Obstetrics

## 2019-01-13 DIAGNOSIS — B9689 Other specified bacterial agents as the cause of diseases classified elsewhere: Secondary | ICD-10-CM

## 2019-01-13 LAB — CERVICOVAGINAL ANCILLARY ONLY
Bacterial Vaginitis (gardnerella): POSITIVE — AB
Candida Glabrata: NEGATIVE
Candida Vaginitis: NEGATIVE
Chlamydia: NEGATIVE
Comment: NEGATIVE
Comment: NEGATIVE
Comment: NEGATIVE
Comment: NEGATIVE
Comment: NEGATIVE
Comment: NORMAL
Neisseria Gonorrhea: NEGATIVE
Trichomonas: NEGATIVE

## 2019-01-13 MED ORDER — METRONIDAZOLE 500 MG PO TABS: 500.0000 mg | ORAL_TABLET | Freq: Two times a day (BID) | ORAL | 2 refills | Status: DC

## 2019-01-15 LAB — CYTOLOGY - PAP
Comment: NEGATIVE
Diagnosis: UNDETERMINED — AB
High risk HPV: NEGATIVE

## 2019-01-20 ENCOUNTER — Other Ambulatory Visit (HOSPITAL_COMMUNITY): Payer: Self-pay | Admitting: General Surgery

## 2019-01-20 ENCOUNTER — Other Ambulatory Visit: Payer: Self-pay | Admitting: General Surgery

## 2019-01-22 ENCOUNTER — Other Ambulatory Visit: Payer: Self-pay

## 2019-01-22 ENCOUNTER — Encounter: Payer: 59 | Attending: General Surgery | Admitting: Skilled Nursing Facility1

## 2019-01-22 DIAGNOSIS — E669 Obesity, unspecified: Secondary | ICD-10-CM | POA: Diagnosis not present

## 2019-01-22 NOTE — Progress Notes (Signed)
Nutrition Assessment for Bariatric Surgery Medical Nutrition Therapy Appt Start Time: 2:09 End Time: 3:00  Patient was seen on 01/22/2019 for Pre-Operative Nutrition Assessment. Letter of approval faxed to [facility] bariatric surgery program coordinator on 01/22/2019  Referral stated Supervised Weight Loss (SWL) visits needed: 0  Planned surgery: Sleeve Gastrectomy  Pt expectation of surgery: to enjoy her vacations  Pt expectation of dietitian: to help with food decisions     NUTRITION ASSESSMENT   Anthropometrics  Start weight at [facility]: 306.6 lbs (date: 01/22/2019)  Height: 63 in BMI: 54.31 kg/m2     Clinical  Medical hx:  Medications:  Labs:  Notable signs/symptoms:   Lifestyle & Dietary Hx (living situation, sleep regimen, functional ability, weight hx, current dietary patterns, fluid intake, supplements, physical activity, etc)  Pt states her husband is very supportive.  Pt states giving up butter, alcohol, and sweet tea is going to be very difficult for her.  24-Hr Dietary Recall First Meal: sausage with bread and mustard Snack:  Second Meal: skipped Snack:  Third Meal: pizza or chicken rice and vegetables Snack:  Beverages: sweet tea, water, diet soda   Estimated Energy Needs Calories: 1600 Carbohydrate: 180 g Protein: 120g Fat: 44g   NUTRITION DIAGNOSIS  Overweight/obesity ([place]-3.3) related to past poor dietary habits and physical inactivity as evidenced by patient w/ planned sleeve gastrectomy surgery following dietary guidelines for continued weight loss.    NUTRITION INTERVENTION  Nutrition counseling (C-1) and education (E-2) to facilitate bariatric surgery goals.   Pre-Op Goals Reviewed with the Patient . Track food and beverage intake (pen and paper, MyFitness Pal, Baritastic app, etc.) . Make healthy food choices while monitoring portion sizes . Consume 3 meals per day or try to eat every 3-5 hours . Avoid concentrated sugars and  fried foods . Keep sugar & fat in the single digits per serving on food labels . Practice CHEWING your food (aim for applesauce consistency) . Practice not drinking 15 minutes before, during, and 30 minutes after each meal and snack . Avoid all carbonated beverages (ex: soda, sparkling beverages)  . Limit caffeinated beverages (ex: coffee, tea, energy drinks) . Avoid all sugar-sweetened beverages (ex: regular soda, sports drinks)  . Avoid alcohol  . Aim for 64-100 ounces of FLUID daily (with at least half of fluid intake being plain water)  . Aim for at least 60-80 grams of PROTEIN daily . Look for a liquid protein source that contains ?15 g protein and ?5 g carbohydrate (ex: shakes, drinks, shots) . Make a list of non-food related activities . Physical activity is an important part of a healthy lifestyle so keep it moving! The goal is to reach 150 minutes of exercise per week, including cardiovascular and weight baring activity. . Limit butterj  *Goals that are bolded indicate the pt would like to start working towards these  Handouts Provided Include  . Bariatric Surgery handouts (Nutrition Visits, Pre-Op Goals, Protein Shakes, Vitamins & Minerals)  Learning Style & Readiness for Change Teaching method utilized: Visual & Auditory  Demonstrated degree of understanding via: Teach Back  Barriers to learning/adherence to lifestyle change: leisure lifestyle      MONITORING & EVALUATION Dietary intake, weekly physical activity, body weight, and pre-op goals reached at next nutrition visit.    Next Steps  Patient is to call [facility] to enroll in Pre-Op Class (>2 weeks before surgery) and Post-Op Class (2 weeks after surgery) for further nutrition education when surgery date is scheduled.

## 2019-01-27 ENCOUNTER — Ambulatory Visit (HOSPITAL_COMMUNITY): Payer: 59

## 2019-01-28 ENCOUNTER — Other Ambulatory Visit: Payer: Self-pay

## 2019-01-28 ENCOUNTER — Ambulatory Visit (HOSPITAL_COMMUNITY)
Admission: RE | Admit: 2019-01-28 | Discharge: 2019-01-28 | Disposition: A | Discharge status: Home / Self Care | Payer: 59 | Source: Ambulatory Visit | Attending: General Surgery | Admitting: General Surgery

## 2019-01-28 ENCOUNTER — Other Ambulatory Visit (HOSPITAL_COMMUNITY): Payer: Self-pay | Admitting: General Surgery

## 2019-01-29 ENCOUNTER — Encounter: Payer: Self-pay | Admitting: Neurology

## 2019-01-29 ENCOUNTER — Ambulatory Visit (INDEPENDENT_AMBULATORY_CARE_PROVIDER_SITE_OTHER): Payer: 59 | Admitting: Neurology

## 2019-01-29 VITALS — BP 147/88 | HR 95 | Temp 97.4°F | Ht 63.0 in | Wt 314.5 lb

## 2019-01-29 DIAGNOSIS — R0681 Apnea, not elsewhere classified: Secondary | ICD-10-CM

## 2019-01-29 DIAGNOSIS — R0683 Snoring: Secondary | ICD-10-CM | POA: Diagnosis not present

## 2019-01-29 DIAGNOSIS — R0689 Other abnormalities of breathing: Secondary | ICD-10-CM

## 2019-01-29 DIAGNOSIS — R351 Nocturia: Secondary | ICD-10-CM

## 2019-01-29 DIAGNOSIS — Z9189 Other specified personal risk factors, not elsewhere classified: Secondary | ICD-10-CM

## 2019-01-29 DIAGNOSIS — Z6841 Body Mass Index (BMI) 40.0 and over, adult: Secondary | ICD-10-CM

## 2019-01-29 NOTE — Progress Notes (Signed)
Subjective:    Patient ID: Susan Faulkner is a 38 y.o. female.  HPI     Huston Foley, MD, PhD [facility] [address] [address] [place], [place] [address]  Dear Dr. [name], I saw your patient, Susan Faulkner, upon your kind request in my sleep clinic today for initial consultation of her sleep disorder, in particular, concern for underlying obstructive sleep apnea.  The patient is unaccompanied today.  As you know, Susan Faulkner is a 38 year old right-handed woman with an underlying medical history of asthma, anemia, prior smoking, and morbid obesity with a BMI of over 50, who reports snoring and witnessed apneas.  I reviewed your office note from 01/15/2019.  She is being considered for bariatric surgery.  Her Epworth sleepiness score is 5 out of 24, fatigue severity score is 35 out of 63.  She is planning to have bariatric surgery by end of December.  She is currently not working.  She used to work as an Research scientist (medical).  Her husband has noted his snoring but also apneic pauses while asleep and has to not her from time to time.  In addition, she has woken up with a sense of gasping for air.  She denies morning headaches, she has nocturia about once per average night, she is not aware of any family history of sleep apnea.  She drinks caffeine in the form of sweet tea, 1 large serving per day on average, typically no coffee and occasional diet soda, may be up to 3  times a week. She drinks alcohol occasionally, she is a non-smoker, current bedtime and rise time a variable because she does not have to keep a schedule.  She lives with her husband, no pets in the household, she has no biological children.  She has a Museum/gallery conservator who does not live with him.  She may go to bed by 9 PM or as late as 2 AM, rise time generally speaking is between 9 AM and 10:30 AM currently.  Her Past Medical History Is Significant For: Past Medical History:  Diagnosis Date  . Asthma    . Ectopic pregnancy 2013   2    Her Past Surgical History Is Significant For: Past Surgical History:  Procedure Laterality Date  . SALPINGECTOMY      Her Family History Is Significant For: Family History  Problem Relation Age of Onset  . Hypertension Mother   . Diabetes Mother   . Hypertension Father     Her Social History Is Significant For: Social History   Socioeconomic History  . Marital status: Married    Spouse name: Not on file  . Number of children: Not on file  . Years of education: Not on file  . Highest education level: Not on file  Occupational History  . Not on file  Social Needs  . Financial resource strain: Not on file  . Food insecurity    Worry: Not on file    Inability: Not on file  . Transportation needs    Medical: Not on file    Non-medical: Not on file  Tobacco Use  . Smoking status: Former Games developer  . Smokeless tobacco: Never Used  Substance and Sexual Activity  . Alcohol use: Not Currently    Comment: occ  . Drug use: No  . Sexual activity: Yes    Partners: Male    Birth control/protection: None  Lifestyle  . Physical activity    Days per week: Not on  file    Minutes per session: Not on file  . Stress: Not on file  Relationships  . Social Herbalist on phone: Not on file    Gets together: Not on file    Attends religious service: Not on file    Active member of club or organization: Not on file    Attends meetings of clubs or organizations: Not on file    Relationship status: Not on file  Other Topics Concern  . Not on file  Social History Narrative  . Not on file    Her Allergies Are:  Allergies  Allergen Reactions  . Shellfish Allergy Anaphylaxis and Swelling  . Iodine Other (See Comments)    No known reaction - was told to say she's allergic due to the reaction with shellfish   :   Her Current Medications Are:  Outpatient Encounter Medications as of 01/29/2019  Medication Sig  . albuterol (PROVENTIL  HFA;VENTOLIN HFA) 108 (90 BASE) MCG/ACT inhaler Inhale 2 puffs into the lungs every 4 (four) hours as needed for wheezing or shortness of breath.  . Cholecalciferol (VITAMIN D3) 50000 units CAPS TAKE 1 CAPSULE BY MOUTH ONCE A WEEK FOR 12 WEEKS  . metFORMIN (GLUCOPHAGE XR) 500 MG 24 hr tablet Take 3 tablets (1,500 mg total) by mouth daily after supper.  . metroNIDAZOLE (FLAGYL) 500 MG tablet Take 1 tablet (500 mg total) by mouth 2 (two) times daily.  . prenatal vitamin w/FE, FA (PRENATAL 1 + 1) 27-1 MG TABS tablet Take 1 tablet by mouth daily before breakfast.  . [DISCONTINUED] azithromycin (ZITHROMAX) 500 MG tablet Take 2 tablets (1,000 mg total) by mouth daily. (Patient not taking: Reported on 01/29/2019)  . [DISCONTINUED] Cefixime (SUPRAX) 400 MG CAPS capsule Take 1 capsule (400 mg total) by mouth daily. (Patient not taking: Reported on 01/29/2019)  . [DISCONTINUED] cyclobenzaprine (FLEXERIL) 10 MG tablet Take 1 tablet (10 mg total) by mouth 2 (two) times daily as needed for muscle spasms. (Patient not taking: Reported on 01/29/2019)  . [DISCONTINUED] dicyclomine (BENTYL) 20 MG tablet Take 1 tablet (20 mg total) by mouth 2 (two) times daily. (Patient not taking: Reported on 01/29/2019)  . [DISCONTINUED] HYDROcodone-acetaminophen (NORCO/VICODIN) 5-325 MG tablet Take 1 tablet by mouth every 4 (four) hours as needed. (Patient not taking: Reported on 01/29/2019)  . [DISCONTINUED] ibuprofen (ADVIL,MOTRIN) 800 MG tablet Take 1 tablet (800 mg total) by mouth 3 (three) times daily. (Patient not taking: Reported on 01/29/2019)  . [DISCONTINUED] metroNIDAZOLE (FLAGYL) 500 MG tablet Take 1 tablet (500 mg total) by mouth 2 (two) times daily. (Patient not taking: Reported on 01/29/2019)  . [DISCONTINUED] ondansetron (ZOFRAN ODT) 4 MG disintegrating tablet Take 1 tablet (4 mg total) by mouth every 8 (eight) hours as needed for nausea or vomiting. (Patient not taking: Reported on 01/29/2019)  . [DISCONTINUED]  oxyCODONE-acetaminophen (PERCOCET/ROXICET) 5-325 MG tablet Take 1 tablet by mouth every 4 (four) hours as needed for severe pain. (Patient not taking: Reported on 01/29/2019)   No facility-administered encounter medications on file as of 01/29/2019.   :  Review of Systems:  Out of a complete 14 point review of systems, all are reviewed and negative with the exception of these symptoms as listed below:  Review of Systems  Constitutional: Negative.   HENT: Negative.   Eyes: Negative.   Respiratory: Negative.   Cardiovascular: Negative.   Gastrointestinal: Negative.   Endocrine: Negative.   Genitourinary: Negative.   Musculoskeletal: Negative.   Skin: Negative.  Allergic/Immunologic: Negative.   Neurological: Negative.        Snoring  Epworth Sleepiness Scale 0= would never doze 1= slight chance of dozing 2= moderate chance of dozing 3= high chance of dozing  Sitting and reading:0 Watching TV:1 Sitting inactive in a public place (ex. Theater or meeting):0 As a passenger in a car for an hour without a break:1 Lying down to rest in the afternoon:2 Sitting and talking to someone:0 Sitting quietly after lunch (no alcohol):1 In a car, while stopped in traffic:0 Total:5   Hematological: Negative.   Psychiatric/Behavioral: Positive for sleep disturbance.    Objective:  Neurological Exam  Physical Exam Physical Examination:   Vitals:   01/29/19 1539  BP: (!) 147/88  Pulse: 95  Temp: (!) 97.4 F (36.3 C)    General Examination: The patient is a very pleasant 38 y.o. female in no acute distress. She appears well-developed and well-nourished and well groomed.   HEENT: Normocephalic, atraumatic, pupils are equal, round and reactive to light, extraocular tracking is good without limitation to gaze excursion or nystagmus noted. Normal smooth pursuit is noted. Hearing is grossly intact. Face is symmetric with normal facial animation and normal facial sensation. Speech is clear  with no dysarthria noted. There is no hypophonia. There is no lip, neck/head, jaw or voice tremor. Neck is supple with full range of passive and active motion. There are no carotid bruits on auscultation. Oropharynx exam reveals: mild mouth dryness, good dental hygiene and moderate airway crowding, due to Tonsillar size of 2-3+, Mallampati class II,Small airway entry, tongue protrudes centrally in palate elevates symmetrically, neck circumference is 17-7/8 inches.  She has a mild overbite.  Chest: Clear to auscultation without wheezing, rhonchi or crackles noted.  Heart: S1+S2+0, regular and normal without murmurs, rubs or gallops noted.   Abdomen: Soft, non-tender and non-distended with normal bowel sounds appreciated on auscultation.  Extremities: There is no pitting edema in the distal lower extremities bilaterally. Pedal pulses are intact.  Skin: Warm and dry without trophic changes noted.  Musculoskeletal: exam reveals no obvious joint deformities, tenderness or joint swelling or erythema.   Neurologically:  Mental status: The patient is awake, alert and oriented in all 4 spheres. Her immediate and remote memory, attention, language skills and fund of knowledge are appropriate. There is no evidence of aphasia, agnosia, apraxia or anomia. Speech is clear with normal prosody and enunciation. Thought process is linear. Mood is normal and affect is normal.  Cranial nerves II - XII are as described above under HEENT exam.  Motor exam: Normal bulk, strength and tone is noted. There is no tremor. Fine motor skills and coordination: grossly intact.  Cerebellar testing: No dysmetria or intention tremor on finger to nose testing. Heel to shin is unremarkable bilaterally. There is no truncal or gait ataxia.  Sensory exam: intact to light touch in the upper and lower extremities.  Gait, station and balance: She stands easily. No veering to one side is noted. No leaning to one side is noted. Posture is  age-appropriate and stance is narrow based. Gait shows normal stride length and normal pace. No problems turning are noted.   Assessment and Plan:  In summary, Eston EstersLetisha C Galant is a very pleasant 38 y.o.-year old female with an underlying medical history of asthma, anemia, prior smoking, and morbid obesity with a BMI of over 50, whose history and physical exam are concerning for obstructive sleep apnea (OSA). I had a long chat with the patient about  my findings and the diagnosis of OSA, its prognosis and treatment options. We talked about medical treatments, surgical interventions and non-pharmacological approaches. I explained in particular the risks and ramifications of untreated moderate to severe OSA, especially with respect to developing cardiovascular disease down the Road, including congestive heart failure, difficult to treat hypertension, cardiac arrhythmias, or stroke. Even type 2 diabetes has, in part, been linked to untreated OSA. Symptoms of untreated OSA include daytime sleepiness, memory problems, mood irritability and mood disorder such as depression and anxiety, lack of energy, as well as recurrent headaches, especially morning headaches. We talked about trying to maintain a healthy lifestyle in general, as well as the importance of weight control. We also talked about the importance of good sleep hygiene. I recommended the following at this time: sleep study.  I explained the sleep test procedure to the patient and also outlined possible surgical and non-surgical treatment options of OSA, including the use of a custom-made dental device or upper airway surgical options. Realistically speaking, given that she is pursuing weight loss surgery, a CPAP or AutoPap machine would be her best treatment option. I explained the CPAP treatment option to the patient, who indicated that she would be willing to try CPAP if the need arises. I explained the importance of being compliant with PAP treatment,  not only for insurance purposes but primarily to improve Her symptoms, and for the patient's long term health benefit, including to reduce Her cardiovascular risks. I answered all her questions today and the patient was in agreement. I plan to see her back after the sleep study is completed and encouraged her to call with any interim questions, concerns, problems or updates.   Thank you very much for allowing me to participate in the care of this nice patient. If I can be of any further assistance to you please do not hesitate to call me at [phone].  Sincerely,   Huston Foley, MD, PhD

## 2019-01-29 NOTE — Patient Instructions (Signed)

## 2019-02-02 ENCOUNTER — Ambulatory Visit (HOSPITAL_COMMUNITY): Payer: 59

## 2019-02-04 ENCOUNTER — Encounter: Payer: Self-pay | Admitting: Obstetrics

## 2019-02-09 ENCOUNTER — Other Ambulatory Visit: Payer: Self-pay

## 2019-02-09 ENCOUNTER — Encounter: Payer: 59 | Attending: General Surgery | Admitting: Skilled Nursing Facility1

## 2019-02-09 ENCOUNTER — Ambulatory Visit (INDEPENDENT_AMBULATORY_CARE_PROVIDER_SITE_OTHER): Payer: 59 | Admitting: Neurology

## 2019-02-09 DIAGNOSIS — R0683 Snoring: Secondary | ICD-10-CM

## 2019-02-09 DIAGNOSIS — R0681 Apnea, not elsewhere classified: Secondary | ICD-10-CM

## 2019-02-09 DIAGNOSIS — G4733 Obstructive sleep apnea (adult) (pediatric): Secondary | ICD-10-CM | POA: Diagnosis not present

## 2019-02-09 DIAGNOSIS — R351 Nocturia: Secondary | ICD-10-CM

## 2019-02-09 DIAGNOSIS — E669 Obesity, unspecified: Secondary | ICD-10-CM | POA: Diagnosis present

## 2019-02-09 DIAGNOSIS — Z6841 Body Mass Index (BMI) 40.0 and over, adult: Secondary | ICD-10-CM

## 2019-02-09 DIAGNOSIS — R0689 Other abnormalities of breathing: Secondary | ICD-10-CM

## 2019-02-09 DIAGNOSIS — Z9189 Other specified personal risk factors, not elsewhere classified: Secondary | ICD-10-CM

## 2019-02-09 NOTE — Progress Notes (Signed)
Pre-Operative Nutrition Class:  Appt start time: 5597   End time:  1830.  Patient was seen on 02/09/2019 for Pre-Operative Bariatric Surgery Education at [facility].   Surgery date:  Surgery type: sleeve Start weight at [facility]: 306.6 Weight today: 309  Samples given per MNT protocol. Patient educated on appropriate usage: Celebrate calcium Lot: [id number] Exp 11/21 The following the learning objectives were met by the patient during this course:  Identify Pre-Op Dietary Goals and will begin 2 weeks pre-operatively  Identify appropriate sources of fluids and proteins   State protein recommendations and appropriate sources pre and post-operatively  Identify Post-Operative Dietary Goals and will follow for 2 weeks post-operatively  Identify appropriate multivitamin and calcium sources  Describe the need for physical activity post-operatively and will follow MD recommendations  State when to call healthcare provider regarding medication questions or post-operative complications  Handouts given during class include:  Pre-Op Bariatric Surgery Diet Handout  Protein Shake Handout  Post-Op Bariatric Surgery Nutrition Handout  BELT Program Information Flyer  Support Group Information Flyer  [facility] Bariatric Supplements Price List  Follow-Up Plan: Patient will follow-up at [facility] 2 weeks post operatively for diet advancement per MD.

## 2019-02-17 ENCOUNTER — Telehealth: Payer: Self-pay | Admitting: Neurology

## 2019-02-17 NOTE — Progress Notes (Signed)
Patient referred by Dr. Redmond Pulling from [facility], seen by me on 01/29/19, HST on 02/09/19.    Please call and notify the patient that the recent home sleep test showed obstructive sleep apnea in the moderate range, AHI was in the near severe range of 28.2/h and O2 nadir was 71%. While I recommend treatment for this in the form CPAP, her insurance will not approve a sleep study for this. They will likely only approve a trial of autoPAP, which means, that we don't have to bring her in for a sleep study with CPAP, but will let her start using an autoPAP machine at home, through a DME company (of her choice, or as per insurance requirement). The DME representative will educate her on how to use the machine, how to put the mask on, etc. I have placed an order in the chart. Please send referral, talk to patient, send report to referring MD. We will need a FU in sleep clinic for 10 weeks post-PAP set up, please arrange that with me or one of our NPs. Thanks,   Star Age, MD, PhD [facility])

## 2019-02-17 NOTE — Telephone Encounter (Signed)
Called patient to discuss sleep study results. No answer at this time. LVM for the patient to call back.   

## 2019-02-17 NOTE — Telephone Encounter (Signed)
-----   Message from Star Age, MD sent at 02/17/2019  8:05 AM EST ----- Patient referred by Dr. Redmond Pulling from [facility], seen by me on 01/29/19, HST on 02/09/19.    Please call and notify the patient that the recent home sleep test showed obstructive sleep apnea in the moderate range, AHI was in the near severe range of 28.2/h and O2 nadir was 71%. While I recommend treatment for this in the form CPAP, her insurance will not approve a sleep study for this. They will likely only approve a trial of autoPAP, which means, that we don't have to bring her in for a sleep study with CPAP, but will let her start using an autoPAP machine at home, through a DME company (of her choice, or as per insurance requirement). The DME representative will educate her on how to use the machine, how to put the mask on, etc. I have placed an order in the chart. Please send referral, talk to patient, send report to referring MD. We will need a FU in sleep clinic for 10 weeks post-PAP set up, please arrange that with me or one of our NPs. Thanks,   Star Age, MD, PhD [facility]

## 2019-02-17 NOTE — Addendum Note (Signed)
Addended by: Star Age on: 02/17/2019 08:05 AM   Modules accepted: Orders

## 2019-02-17 NOTE — Procedures (Signed)
Patient Information     First Name: Susan Last Name: Faulkner ID: [id number]  Birth Date: 1980-09-01 Age: 38 Gender: Female  Referring Provider: Dr. Redmond Pulling BMI: 55.9 (W=315 lb, H=5' 3'')  Neck Circ.:  18 '' Epworth:  5/24   Sleep Study Information    Study Date: Feb 09, 2019 S/H/A Version: 001.001.001.001 / 4.1.1528 / 70  History:    38 year old woman with a history of asthma, anemia, prior smoking, and morbid obesity with a BMI of over 50, who reports snoring and witnessed apneas. She is being considered for bariatric surgery. Summary & Diagnosis:     OSA  Recommendations:     This home sleep test demonstrates moderate obstructive sleep apnea with a total AHI of 28.2/hour and O2 nadir of 71%. Snoring appeared to be moderate to loud. Given the patient's medical history and sleep related complaints, treatment with positive airway pressure (in the form of CPAP) is recommended. This will require a full night CPAP titration study for proper treatment settings, O2 monitoring and mask fitting. Based on the severity of the sleep disordered breathing an attended titration study is indicated. However, patient's insurance has denied an attended sleep study; therefore, the patient will be advised to proceed with an autoPAP titration/trial at home for now. Please note that untreated obstructive sleep apnea may carry additional perioperative morbidity. Patients with significant obstructive sleep apnea should receive perioperative PAP therapy and the surgeons and particularly the anesthesiologist should be informed of the diagnosis and the severity of the sleep disordered breathing. The patient should be cautioned not to drive, work at heights, or operate dangerous or heavy equipment when tired or sleepy. Review and reiteration of good sleep hygiene measures should be pursued with any patient. Other causes of the patient's symptoms, including circadian rhythm disturbances, an underlying mood disorder, medication  effect and/or an underlying medical problem cannot be ruled out based on this test. Clinical correlation is recommended. The patient and her referring provider will be notified of the test results. The patient will be seen in follow up in sleep clinic at [facility].  I certify that I have reviewed the raw data recording prior to the issuance of this report in accordance with the standards of the American Academy of Sleep Medicine (AASM).  Star Age, MD, PhD [facility]) Diplomat, ABPN (Neurology and Sleep)            Sleep Summary  Oxygen Saturation Statistics   Start Study Time: End Study Time: Total Recording Time:  7:35:42 PM 3:48:05 AM 8 h, 12 min  Total Sleep Time % REM of Sleep Time:  6 h, 39 min  20.5    Mean: 94 Minimum: 71 Maximum: 99  Mean of Desaturations Nadirs (%):   88  Oxygen Desaturation. %:   4-9 10-20 >20 Total  Events Number Total    77  46 60.6 36.2  4 3.1  127 100.0  Oxygen Saturation: <90 <=88 <85 <80 <70  Duration (minutes): Sleep % 23.6 5.9 18.0 6.6 4.5 1.7 1.3 0.3 0.0 0.0     Respiratory Indices      Total Events REM NREM All Night  pRDI:  211  pAHI:  186 ODI:  127  pAHIc:  0  % CSR: 0.0 65.8 65.8 62.2 0.0 23.3 18.5 8.2 0.0 32.0 28.2 19.3 0.0       Pulse Rate Statistics during Sleep (BPM)      Mean: 83 Minimum: 56 Maximum: 112  Indices are calculated using technically valid sleep time of 6 h, 35 min. Central-Indices are calculated using technically valid sleep time of 6 h, 20 min. pRDI/pAHI are calculated using oxi desaturations ? 3%  Body Position Statistics  Position Supine Prone Right Left Non-Supine  Sleep (min) 273.4 72.0 1.0 53.0 126.0  Sleep % 68.4 18.0 0.3 13.3 31.6  pRDI 32.3 31.1 N/A 30.9 31.3  pAHI 29.5 25.3 N/A 25.1 25.5  ODI 19.9 16.0 N/A 19.4 17.8     Snoring Statistics Snoring Level (dB) >40 >50 >60 >70 >80 >Threshold (45)  Sleep (min) 363.9 26.2 5.1 2.0 0.0 52.3  Sleep %  91.1 6.6 1.3 0.5 0.0 13.1    Mean: 43 dB Sleep Stages Chart                                           pAHI=28.2                                                       Mild              Moderate                    Severe                                                 5              15                    30

## 2019-02-23 ENCOUNTER — Encounter: Payer: Self-pay | Admitting: Neurology

## 2019-02-23 NOTE — Telephone Encounter (Signed)
I called pt. I advised pt that Dr. Rexene Alberts reviewed their sleep study results and found that pt has moderate to severe sleep apnea. Dr. Rexene Alberts recommends that pt starts auto CPAP. I reviewed PAP compliance expectations with the pt. Pt is agreeable to starting a CPAP. I advised pt that an order will be sent to a DME, [facility], and [facility] will call the pt within about one week after they file with the pt's insurance. [facility] will show the pt how to use the machine, fit for masks, and troubleshoot the CPAP if needed. A follow up appt was made for insurance purposes with Ward Givens, NP on Jan 28,2021 at 10 am. Pt verbalized understanding to arrive 15 minutes early and bring their CPAP. A letter with all of this information in it will be mailed to the pt as a reminder. I verified with the pt that the address we have on file is correct. Pt verbalized understanding of results. Pt had no questions at this time but was encouraged to call back if questions arise. I have sent the order to [facility] and have received confirmation that they have received the order.

## 2019-02-25 ENCOUNTER — Ambulatory Visit: Payer: Self-pay | Admitting: General Surgery

## 2019-02-25 NOTE — H&P (View-Only) (Signed)
Susan Faulkner Documented: 02/25/2019 9:09 AM Location: [facility] Patient #: [id number] DOB: [birth year] Married / Language: English / Race: Black or African American Female  History of Present Illness Susan Areola M. [name] MD; 02/25/2019 9:30 AM) The patient is a 38 year old female who presents for a bariatric surgery evaluation. She comes in today for another visit regarding her severe obesity. She denies any medical changes since she was initially seen. She did undergo a home sleep study which showed moderate obstructive sleep apnea. There starting a trial of home AutoPap. Otherwise she denies any penne medically. Chest x-ray and EKG were unremarkable. Upper GI showed no hiatal hernia. There is a questionable nonrestrictive distal esophageal ring. A1c was less than 5. Cholesterol panel was normal except for a HDL level being a little low at 43. She is still interested in sleeve gastrectomy. She denies any chest pain, chest pressure, source of breath, dyspnea on exertion, fever, chills, nausea, vomiting, abdominal pain.   01/15/19 She is referred by Dr Peri Maris for consideration for weight loss surgery. Her ob/gyn is Dr Coral Ceo. She completed our Futures trader. She is interested in a sleeve gastrectomy. She is interested in improving her overall health and improving her fertility. Despite numerous attempts for sustained weight loss she's been unsuccessful. She has tried Atkins on several occasions, low-calorie diets, intermittent fasting as well as remote history Weight Watchers-all without any long-term success. She has been working with her PCP monthly for the past several months on a exercise and nutrition plan to address her weight.  She denies any chest pain, chest pressure, shortness of breath, dyspnea on exertion, TIAs or amaurosis fugax. She denies any personal or family history of blood clots. She does sleep on 3 pillows. She states that she gets little  bit short of breath if she lays flat. She states that her husband states that she will wake up at times in the middle the night and is a loud snorer. She has been referred for sleep study that was during Covid pandemic and it was canceled. She has an elevated stop bang score.  She denies any heartburn, reflux, indigestion, abdominal pain. She denies any melena or hematochezia. She denies any abdominal pain. She denies any hematuria. She was recently put on an antibiotic for BV. She does have heavy periods. She has had 1 or 2 ectopic pregnancies. She has torn her left ACL in the past. As a result she does have some left knee pain. She is on metformin to help weight loss. She denies any significant migraines or blurry vision. She denies any tobacco or drug use. She drinks alcohol socially.  She had a CBC and a CMET in February. She had some mild anemia with a hemoglobin of around 11.7. A1c was 5 on 01/08/2019   Problem List/Past Medical Susan Areola M. Susan Campanile, MD; 02/25/2019 9:31 AM) SLEEP APNEA, CENTRAL (G47.31) ANEMIA, UNSPECIFIED TYPE (D64.9) LOW SERUM HDL (R74.8) SEVERE OBESITY (E66.01) The patient meets weight loss surgery criteria.  Past Surgical History Susan Areola M. Susan Campanile, MD; 02/25/2019 9:31 AM) No pertinent past surgical history  Diagnostic Studies History Susan Areola M. Susan Campanile, MD; 02/25/2019 9:31 AM) Colonoscopy never Mammogram 1-3 years ago Pap Smear 1-5 years ago  Allergies Susan Faulkner, CMA; 02/25/2019 9:09 AM) Shellfish Iodine (Antiseptic) *ANTISEPTICS & DISINFECTANTS* Allergies Reconciled  Medication History Susan Faulkner, CMA; 02/25/2019 9:09 AM) metFORMIN HCl ER (500MG  Tablet ER 24HR, Oral) Active. metroNIDAZOLE (500MG  Tablet, Oral) Active. Ibuprofen (200MG  Tablet, Oral) Active. Medications Reconciled  Social History (Susan Sherburn M. Susan Abram, MD; 02/25/2019 9:31 AM) Alcohol use Occasional alcohol use. Caffeine use Carbonated beverages, Tea. No drug  use Tobacco use Former smoker.  Family History (Susan Maeda M. [name], MD; 02/25/2019 9:31 AM) Depression Family Members In General. Diabetes Mellitus Mother. Hypertension Family Members In General, Mother, Sister. Malignant Neoplasm Of Pancreas Family Members In General. Ovarian Cancer Family Members In General.  Pregnancy / Birth History (Susan Eugene M. Susan Schaaf, MD; 02/25/2019 9:31 AM) Age at menarche 15 years. Contraceptive History Oral contraceptives. Gravida 2 Maternal age 26-30 Para 0 Regular periods  Other Problems (Susan Mavis M. Susan Bostwick, MD; 02/25/2019 9:31 AM) Asthma Other disease, cancer, significant illness     Review of Systems ([name] [name] MD; 02/25/2019 9:30 AM) General Present- Fatigue and Weight Gain. Not Present- Appetite Loss, Chills, Fever, Night Sweats and Weight Loss. Skin Present- Dryness. Not Present- Change in Wart/Mole, Hives, Jaundice, New Lesions, Non-Healing Wounds, Rash and Ulcer. HEENT Present- Seasonal Allergies. Not Present- Earache, Hearing Loss, Hoarseness, Nose Bleed, Oral Ulcers, Ringing in the Ears, Sinus Pain, Sore Throat, Visual Disturbances, Wears glasses/contact lenses and Yellow Eyes. Respiratory Present- Snoring and Wheezing. Not Present- Bloody sputum, Chronic Cough and Difficulty Breathing. Cardiovascular Present- Shortness of Breath. Not Present- Chest Pain, Difficulty Breathing Lying Down, Leg Cramps, Palpitations, Rapid Heart Rate and Swelling of Extremities. Gastrointestinal Present- Abdominal Pain. Not Present- Bloating, Bloody Stool, Change in Bowel Habits, Chronic diarrhea, Constipation, Difficulty Swallowing, Excessive gas, Gets full quickly at meals, Hemorrhoids, Indigestion, Nausea, Rectal Pain and Vomiting. Female Genitourinary Present- Frequency and Urgency. Not Present- Nocturia, Painful Urination and Pelvic Pain. Musculoskeletal Present- Back Pain. Not Present- Joint Pain, Joint Stiffness, Muscle Pain, Muscle Weakness and Swelling of  Extremities. Neurological Present- Headaches. Not Present- Decreased Memory, Fainting, Numbness, Seizures, Tingling, Tremor, Trouble walking and Weakness. Psychiatric Present- Anxiety. Not Present- Bipolar, Change in Sleep Pattern, Depression, Fearful and Frequent crying. Endocrine Not Present- Cold Intolerance, Excessive Hunger, Hair Changes, Heat Intolerance, Hot flashes and New Diabetes. Hematology Not Present- Blood Thinners, Easy Bruising, Excessive bleeding, Gland problems, HIV and Persistent Infections.  Vitals (Angela Holmes CMA; 02/25/2019 9:10 AM) 02/25/2019 9:09 AM Weight: 310 lb Height: 63in Body Surface Area: 2.33 m Body Mass Index: 54.91 kg/m  Temp.: 97F(Tympanic)  Pulse: 97 (Regular)  BP: 132/84 (Sitting, Left Arm, Standard)        Physical Exam (Henry Utsey M. Zykeria Laguardia MD; 02/25/2019 9:30 AM)  General Mental Status-Alert. General Appearance-Consistent with stated age. Hydration-Well hydrated. Voice-Normal. Note: severe truncal obesity; apple shaped  Head and Neck Head-normocephalic, atraumatic with no lesions or palpable masses. Trachea-midline. Thyroid Gland Characteristics - normal size and consistency.  Eye Eyeball - Bilateral-Normal. Sclera/Conjunctiva - Bilateral-No scleral icterus.  ENMT Ears Pinna - Bilateral - no bony growth in lateral aspect of ear canal, no edema. Nose and Sinuses External Inspection of the Nose - symmetric, no deformities observed.  Chest and Lung Exam Chest and lung exam reveals -quiet, even and easy respiratory effort with no use of accessory muscles and on auscultation, normal breath sounds, no adventitious sounds and normal vocal resonance. Inspection Chest Wall - Normal. Back - normal.  Breast - Did not examine.  Cardiovascular Cardiovascular examination reveals -normal heart sounds, regular rate and rhythm with no murmurs and normal pedal pulses  bilaterally.  Abdomen Inspection Inspection of the abdomen reveals - No Hernias. Skin - Scar - Note: well healed trocar scars. Palpation/Percussion Palpation and Percussion of the abdomen reveal - Soft, Non Tender, No Rebound tenderness, No Rigidity (guarding) and No   hepatosplenomegaly. Auscultation Auscultation of the abdomen reveals - Bowel sounds normal.  Peripheral Vascular Upper Extremity Palpation - Pulses bilaterally normal.  Neurologic Neurologic evaluation reveals -alert and oriented x 3 with no impairment of recent or remote memory. Mental Status-Normal.  Neuropsychiatric The patient's mood and affect are described as -normal. Judgment and Insight-insight is appropriate concerning matters relevant to self.  Musculoskeletal Normal Exam - Left-Upper Extremity Strength Normal and Lower Extremity Strength Normal. Normal Exam - Right-Upper Extremity Strength Normal and Lower Extremity Strength Normal. Note: b/l knee crepitus  Lymphatic Head & Neck  General Head & Neck Lymphatics: Bilateral - Description - Normal. Axillary - Did not examine. Femoral & Inguinal - Did not examine.    Assessment & Plan Randall Hiss M. Lipa Knauff MD; 02/25/2019 9:31 AM)  SEVERE OBESITY (E66.01) Story: The patient meets weight loss surgery criteria. Impression: The patient meets weight loss surgery criteria. I think the patient would be an acceptable candidate for Laparoscopic vertical sleeve gastrectomy.  We reviewed her preoperative workup and her bariatric evaluation. She has received psychological clearance. She has had preoperative bariatric education class. We rediscussed the typical hospitalization. We discussed operating during the coronavirus pandemic and potential risk. All her questions were asked and answered.  Current Plans Pt Education - EMW_preopbariatric  SLEEP APNEA, CENTRAL (G47.31)   LOW SERUM HDL (R74.8)  Leighton Ruff. Redmond Pulling, MD, FACS General, Bariatric, & Minimally  Invasive Surgery [facility] [place]

## 2019-02-25 NOTE — H&P (Signed)
Susan Faulkner Documented: 02/25/2019 9:09 AM Location: [facility] Patient #: [id number] DOB: [birth year] Married / Language: English / Race: Black or African American Female  History of Present Illness [name] Eldar Robitaille MD; 02/25/2019 9:30 AM) The patient is a 38 year old female who presents for a bariatric surgery evaluation. She comes in today for another visit regarding her severe obesity. She denies any medical changes since she was initially seen. She did undergo a home sleep study which showed moderate obstructive sleep apnea. There starting a trial of home AutoPap. Otherwise she denies any penne medically. Chest x-ray and EKG were unremarkable. Upper GI showed no hiatal hernia. There is a questionable nonrestrictive distal esophageal ring. A1c was less than 5. Cholesterol panel was normal except for a HDL level being a little low at 43. She is still interested in sleeve gastrectomy. She denies any chest pain, chest pressure, source of breath, dyspnea on exertion, fever, chills, nausea, vomiting, abdominal pain.   01/15/19 She is referred by Dr Peri Maris for consideration for weight loss surgery. Her ob/gyn is Dr Coral Ceo. She completed our Futures trader. She is interested in a sleeve gastrectomy. She is interested in improving her overall health and improving her fertility. Despite numerous attempts for sustained weight loss she's been unsuccessful. She has tried Atkins on several occasions, low-calorie diets, intermittent fasting as well as remote history Weight Watchers-all without any long-term success. She has been working with her PCP monthly for the past several months on a exercise and nutrition plan to address her weight.  She denies any chest pain, chest pressure, shortness of breath, dyspnea on exertion, TIAs or amaurosis fugax. She denies any personal or family history of blood clots. She does sleep on 3 pillows. She states that she gets little  bit short of breath if she lays flat. She states that her husband states that she will wake up at times in the middle the night and is a loud snorer. She has been referred for sleep study that was during Covid pandemic and it was canceled. She has an elevated stop bang score.  She denies any heartburn, reflux, indigestion, abdominal pain. She denies any melena or hematochezia. She denies any abdominal pain. She denies any hematuria. She was recently put on an antibiotic for BV. She does have heavy periods. She has had 1 or 2 ectopic pregnancies. She has torn her left ACL in the past. As a result she does have some left knee pain. She is on metformin to help weight loss. She denies any significant migraines or blurry vision. She denies any tobacco or drug use. She drinks alcohol socially.  She had a CBC and a CMET in February. She had some mild anemia with a hemoglobin of around 11.7. A1c was 5 on 01/08/2019   Problem List/Past Medical [name] [name], MD; 02/25/2019 9:31 AM) SLEEP APNEA, CENTRAL (G47.31) ANEMIA, UNSPECIFIED TYPE (D64.9) LOW SERUM HDL (R74.8) SEVERE OBESITY (E66.01) The patient meets weight loss surgery criteria.  Past Surgical History [name] [name], MD; 02/25/2019 9:31 AM) No pertinent past surgical history  Diagnostic Studies History [name] [name], MD; 02/25/2019 9:31 AM) Colonoscopy never Mammogram 1-3 years ago Pap Smear 1-5 years ago  Allergies [name], CMA; 02/25/2019 9:09 AM) Shellfish Iodine (Antiseptic) *ANTISEPTICS & DISINFECTANTS* Allergies Reconciled  Medication History [name], CMA; 02/25/2019 9:09 AM) metFORMIN HCl ER (500MG  Tablet ER 24HR, Oral) Active. metroNIDAZOLE (500MG  Tablet, Oral) Active. Ibuprofen (200MG  Tablet, Oral) Active. Medications Reconciled  Social History Minerva Areola(Giann Obara M. Andrey CampanileWilson, MD; 02/25/2019 9:31 AM) Alcohol use Occasional alcohol use. Caffeine use Carbonated beverages, Tea. No drug  use Tobacco use Former smoker.  Family History Minerva Areola(Hazel Wrinkle M. Andrey CampanileWilson, MD; 02/25/2019 9:31 AM) Depression Family Members In General. Diabetes Mellitus Mother. Hypertension Family Members In General, Mother, Sister. Malignant Neoplasm Of Pancreas Family Members In General. Ovarian Cancer Family Members In General.  Pregnancy / Birth History Minerva Areola(Wynonia Medero M. Andrey CampanileWilson, MD; 02/25/2019 9:31 AM) Age at menarche 15 years. Contraceptive History Oral contraceptives. Gravida 2 Maternal age 36-30 Para 0 Regular periods  Other Problems Minerva Areola(Sheehan Stacey M. Andrey CampanileWilson, MD; 02/25/2019 9:31 AM) Asthma Other disease, cancer, significant illness     Review of Systems Minerva Areola(Rokhaya Quinn M. Jovon Winterhalter MD; 02/25/2019 9:30 AM) General Present- Fatigue and Weight Gain. Not Present- Appetite Loss, Chills, Fever, Night Sweats and Weight Loss. Skin Present- Dryness. Not Present- Change in Wart/Mole, Hives, Jaundice, New Lesions, Non-Healing Wounds, Rash and Ulcer. HEENT Present- Seasonal Allergies. Not Present- Earache, Hearing Loss, Hoarseness, Nose Bleed, Oral Ulcers, Ringing in the Ears, Sinus Pain, Sore Throat, Visual Disturbances, Wears glasses/contact lenses and Yellow Eyes. Respiratory Present- Snoring and Wheezing. Not Present- Bloody sputum, Chronic Cough and Difficulty Breathing. Cardiovascular Present- Shortness of Breath. Not Present- Chest Pain, Difficulty Breathing Lying Down, Leg Cramps, Palpitations, Rapid Heart Rate and Swelling of Extremities. Gastrointestinal Present- Abdominal Pain. Not Present- Bloating, Bloody Stool, Change in Bowel Habits, Chronic diarrhea, Constipation, Difficulty Swallowing, Excessive gas, Gets full quickly at meals, Hemorrhoids, Indigestion, Nausea, Rectal Pain and Vomiting. Female Genitourinary Present- Frequency and Urgency. Not Present- Nocturia, Painful Urination and Pelvic Pain. Musculoskeletal Present- Back Pain. Not Present- Joint Pain, Joint Stiffness, Muscle Pain, Muscle Weakness and Swelling of  Extremities. Neurological Present- Headaches. Not Present- Decreased Memory, Fainting, Numbness, Seizures, Tingling, Tremor, Trouble walking and Weakness. Psychiatric Present- Anxiety. Not Present- Bipolar, Change in Sleep Pattern, Depression, Fearful and Frequent crying. Endocrine Not Present- Cold Intolerance, Excessive Hunger, Hair Changes, Heat Intolerance, Hot flashes and New Diabetes. Hematology Not Present- Blood Thinners, Easy Bruising, Excessive bleeding, Gland problems, HIV and Persistent Infections.  Vitals [name](Angela Holmes CMA; 02/25/2019 9:10 AM) 02/25/2019 9:09 AM Weight: 310 lb Height: 63in Body Surface Area: 2.33 m Body Mass Index: 54.91 kg/m  Temp.: 34F(Tympanic)  Pulse: 97 (Regular)  BP: 132/84 (Sitting, Left Arm, Standard)        Physical Exam Minerva Areola(Jeffree Cazeau M. Kymari Lollis MD; 02/25/2019 9:30 AM)  General Mental Status-Alert. General Appearance-Consistent with stated age. Hydration-Well hydrated. Voice-Normal. Note: severe truncal obesity; apple shaped  Head and Neck Head-normocephalic, atraumatic with no lesions or palpable masses. Trachea-midline. Thyroid Gland Characteristics - normal size and consistency.  Eye Eyeball - Bilateral-Normal. Sclera/Conjunctiva - Bilateral-No scleral icterus.  ENMT Ears Pinna - Bilateral - no bony growth in lateral aspect of ear canal, no edema. Nose and Sinuses External Inspection of the Nose - symmetric, no deformities observed.  Chest and Lung Exam Chest and lung exam reveals -quiet, even and easy respiratory effort with no use of accessory muscles and on auscultation, normal breath sounds, no adventitious sounds and normal vocal resonance. Inspection Chest Wall - Normal. Back - normal.  Breast - Did not examine.  Cardiovascular Cardiovascular examination reveals -normal heart sounds, regular rate and rhythm with no murmurs and normal pedal pulses  bilaterally.  Abdomen Inspection Inspection of the abdomen reveals - No Hernias. Skin - Scar - Note: well healed trocar scars. Palpation/Percussion Palpation and Percussion of the abdomen reveal - Soft, Non Tender, No Rebound tenderness, No Rigidity (guarding) and No  hepatosplenomegaly. Auscultation Auscultation of the abdomen reveals - Bowel sounds normal.  Peripheral Vascular Upper Extremity Palpation - Pulses bilaterally normal.  Neurologic Neurologic evaluation reveals -alert and oriented x 3 with no impairment of recent or remote memory. Mental Status-Normal.  Neuropsychiatric The patient's mood and affect are described as -normal. Judgment and Insight-insight is appropriate concerning matters relevant to self.  Musculoskeletal Normal Exam - Left-Upper Extremity Strength Normal and Lower Extremity Strength Normal. Normal Exam - Right-Upper Extremity Strength Normal and Lower Extremity Strength Normal. Note: b/l knee crepitus  Lymphatic Head & Neck  General Head & Neck Lymphatics: Bilateral - Description - Normal. Axillary - Did not examine. Femoral & Inguinal - Did not examine.    Assessment & Plan Randall Hiss M. Anyely Cunning MD; 02/25/2019 9:31 AM)  SEVERE OBESITY (E66.01) Story: The patient meets weight loss surgery criteria. Impression: The patient meets weight loss surgery criteria. I think the patient would be an acceptable candidate for Laparoscopic vertical sleeve gastrectomy.  We reviewed her preoperative workup and her bariatric evaluation. She has received psychological clearance. She has had preoperative bariatric education class. We rediscussed the typical hospitalization. We discussed operating during the coronavirus pandemic and potential risk. All her questions were asked and answered.  Current Plans Pt Education - EMW_preopbariatric  SLEEP APNEA, CENTRAL (G47.31)   LOW SERUM HDL (R74.8)  Leighton Ruff. Redmond Pulling, MD, FACS General, Bariatric, & Minimally  Invasive Surgery [facility], [place]

## 2019-03-13 NOTE — Patient Instructions (Addendum)
DUE TO COVID-19 ONLY ONE VISITOR IS ALLOWED TO COME WITH YOU AND STAY IN THE WAITING ROOM ONLY DURING PRE OP AND PROCEDURE DAY OF SURGERY. THE 1 VISITOR MAY VISIT WITH YOU AFTER SURGERY IN YOUR PRIVATE ROOM DURING VISITING HOURS ONLY!  YOU NEED TO HAVE A COVID 19 TEST ON 03/21/19 @ 9:40 AM, THIS TEST MUST BE DONE BEFORE SURGERY, COME  [address], [place]  , [address].  [facility]) ONCE YOUR COVID TEST IS COMPLETED, PLEASE BEGIN THE QUARANTINE INSTRUCTIONS AS OUTLINED IN YOUR HANDOUT.                ISAURA SCHILLER  03/13/2019   Your procedure is scheduled on: 03-24-19    Report to [facility] Main  Entrance    Report to Admitting at 7:30 AM     Call this number if you have problems the morning of surgery [phone]    Remember: NO SOLID FOOD AFTER 600 PM THE NIGHT BEFORE YOUR SURGERY. YOU MAY DRINK CLEAR FLUIDS.    MORNING OF SURGERY DRINK:   DRINK 1 G2 drink BEFORE YOU LEAVE HOME, DRINK ALL OF THE  G2 DRINK AT ONE TIME. THE G2 DRINK YOU DRINK BEFORE YOU LEAVE HOME WILL BE THE LAST FLUIDS YOU DRINK BEFORE SURGERY. PLEASE CONSUME YOUR G2 DRINK BY 6:30 AM  PAIN IS EXPECTED AFTER SURGERY AND WILL NOT BE COMPLETELY ELIMINATED. AMBULATION AND TYLENOL WILL HELP REDUCE INCISIONAL AND GAS PAIN. MOVEMENT IS KEY!  YOU ARE EXPECTED TO BE OUT OF BED WITHIN 4 HOURS OF ADMISSION TO YOUR PATIENT ROOM.  SITTING IN THE RECLINER THROUGHOUT THE DAY IS IMPORTANT FOR DRINKING FLUIDS AND MOVING GAS THROUGHOUT THE GI TRACT.  COMPRESSION STOCKINGS SHOULD BE WORN [facility] STAY UNLESS YOU ARE WALKING.   INCENTIVE SPIROMETER SHOULD BE USED EVERY HOUR WHILE AWAKE TO DECREASE POST-OPERATIVE COMPLICATIONS SUCH AS PNEUMONIA.  WHEN DISCHARGED HOME, IT IS IMPORTANT TO CONTINUE TO WALK EVERY HOUR AND USE THE INCENTIVE SPIROMETER EVERY HOUR.    CLEAR LIQUID DIET   Foods Allowed                                                                     Foods  Excluded  Coffee and tea, regular and decaf                             liquids that you cannot  Plain Jell-O any favor except red or purple                                           see through such as: Fruit ices (not with fruit pulp)                                     milk, soups, orange juice  Iced Popsicles  All solid food Carbonated beverages, regular and diet                                    Cranberry, grape and apple juices Sports drinks like Gatorade Lightly seasoned clear broth or consume(fat free) Sugar, honey syrup  Sample Menu Breakfast                                Lunch                                     Supper Cranberry juice                    Beef broth                            Chicken broth Jell-O                                     Grape juice                           Apple juice Coffee or tea                        Jell-O                                      Popsicle                                                Coffee or tea                        Coffee or tea  _____________________________________________________________________       Take these medicines the morning of surgery with A SIP OF WATER: None. You may use and bring your inhaler, if needed.   BRUSH YOUR TEETH MORNING OF SURGERY AND RINSE YOUR MOUTH OUT, NO CHEWING GUM CANDY OR MINTS.  DO NOT TAKE ANY DIABETIC MEDICATIONS DAY OF YOUR SURGERY                               You may not have any metal on your body including hair pins and              piercings     Do not wear jewelry, make-up, lotions, powders or perfumes, deodorant              Do not wear nail polish on your fingernails.  Do not shave  48 hours prior to surgery.             Do not bring valuables to the hospital. [facility] IS NOT             RESPONSIBLE   FOR VALUABLES.  Contacts, dentures  or bridgework may not be worn into surgery.  You may bring an overnight bag      Special  Instructions: Please bring your mask and tubing for your CPAP machine              Please read over the following fact sheets you were given: _____________________________________________________________________  How to Manage Your Diabetes Before and After Surgery  Why is it important to control my blood sugar before and after surgery? . Improving blood sugar levels before and after surgery helps healing and can limit problems. . A way of improving blood sugar control is eating a healthy diet by: o  Eating less sugar and carbohydrates o  Increasing activity/exercise o  Talking with your doctor about reaching your blood sugar goals . High blood sugars (greater than 180 mg/dL) can raise your risk of infections and slow your recovery, so you will need to focus on controlling your diabetes during the weeks before surgery. . Make sure that the doctor who takes care of your diabetes knows about your planned surgery including the date and location.  How do I manage my blood sugar before surgery? . Check your blood sugar at least 4 times a day, starting 2 days before surgery, to make sure that the level is not too high or low. o Check your blood sugar the morning of your surgery when you wake up and every 2 hours until you get to the Short Stay unit. . If your blood sugar is less than 70 mg/dL, you will need to treat for low blood sugar: o Do not take insulin. o Treat a low blood sugar (less than 70 mg/dL) with  cup of clear juice (cranberry or apple), 4 glucose tablets, OR glucose gel. o Recheck blood sugar in 15 minutes after treatment (to make sure it is greater than 70 mg/dL). If your blood sugar is not greater than 70 mg/dL on recheck, call [phone] for further instructions. . Report your blood sugar to the short stay nurse when you get to Short Stay.  . If you are admitted to the hospital after surgery: o Your blood sugar will be checked by the staff and you will probably be given  insulin after surgery (instead of oral diabetes medicines) to make sure you have good blood sugar levels. o The goal for blood sugar control after surgery is 80-180 mg/dL.   WHAT DO I DO ABOUT MY DIABETES MEDICATION?  Marland Kitchen Do not take oral diabetes medicines (pills) the morning of surgery.  . THE DAY BEFORE SURGERY, take your Metformin as prescribed (Pt is not taking )                [facility] - Preparing for Surgery Before surgery, you can play an important role.  Because skin is not sterile, your skin needs to be as free of germs as possible.  You can reduce the number of germs on your skin by washing with CHG (chlorahexidine gluconate) soap before surgery.  CHG is an antiseptic cleaner which kills germs and bonds with the skin to continue killing germs even after washing. Please DO NOT use if you have an allergy to CHG or antibacterial soaps.  If your skin becomes reddened/irritated stop using the CHG and inform your nurse when you arrive at Short Stay. Do not shave (including legs and underarms) for at least 48 hours prior to the first CHG shower.  You may shave your face/neck. Please follow these instructions carefully:  1.  Shower with CHG Soap the night before surgery and the  morning of Surgery.  2.  If you choose to wash your hair, wash your hair first as usual with your  normal  shampoo.  3.  After you shampoo, rinse your hair and body thoroughly to remove the  shampoo.                           4.  Use CHG as you would any other liquid soap.  You can apply chg directly  to the skin and wash                       Gently with a scrungie or clean washcloth.  5.  Apply the CHG Soap to your body ONLY FROM THE NECK DOWN.   Do not use on face/ open                           Wound or open sores. Avoid contact with eyes, ears mouth and genitals (private parts).                       Wash face,  Genitals (private parts) with your normal soap.             6.  Wash thoroughly, paying special  attention to the area where your surgery  will be performed.  7.  Thoroughly rinse your body with warm water from the neck down.  8.  DO NOT shower/wash with your normal soap after using and rinsing off  the CHG Soap.                9.  Pat yourself dry with a clean towel.            10.  Wear clean pajamas.            11.  Place clean sheets on your bed the night of your first shower and do not  sleep with pets. Day of Surgery : Do not apply any lotions/deodorants the morning of surgery.  Please wear clean clothes to the hospital/surgery center.  FAILURE TO FOLLOW THESE INSTRUCTIONS MAY RESULT IN THE CANCELLATION OF YOUR SURGERY PATIENT SIGNATURE_________________________________  NURSE SIGNATURE__________________________________  ________________________________________________________________________   Rogelia Mire  An incentive spirometer is a tool that can help keep your lungs clear and active. This tool measures how well you are filling your lungs with each breath. Taking long deep breaths may help reverse or decrease the chance of developing breathing (pulmonary) problems (especially infection) following:  A long period of time when you are unable to move or be active. BEFORE THE PROCEDURE   If the spirometer includes an indicator to show your best effort, your nurse or respiratory therapist will set it to a desired goal.  If possible, sit up straight or lean slightly forward. Try not to slouch.  Hold the incentive spirometer in an upright position. INSTRUCTIONS FOR USE  1. Sit on the edge of your bed if possible, or sit up as far as you can in bed or on a chair. 2. Hold the incentive spirometer in an upright position. 3. Breathe out normally. 4. Place the mouthpiece in your mouth and seal your lips tightly around it. 5. Breathe in slowly and as deeply as possible, raising the piston or the ball toward the top of the column. 6. Hold  your breath for 3-5 seconds or for  as long as possible. Allow the piston or ball to fall to the bottom of the column. 7. Remove the mouthpiece from your mouth and breathe out normally. 8. Rest for a few seconds and repeat Steps 1 through 7 at least 10 times every 1-2 hours when you are awake. Take your time and take a few normal breaths between deep breaths. 9. The spirometer may include an indicator to show your best effort. Use the indicator as a goal to work toward during each repetition. 10. After each set of 10 deep breaths, practice coughing to be sure your lungs are clear. If you have an incision (the cut made at the time of surgery), support your incision when coughing by placing a pillow or rolled up towels firmly against it. Once you are able to get out of bed, walk around indoors and cough well. You may stop using the incentive spirometer when instructed by your caregiver.  RISKS AND COMPLICATIONS  Take your time so you do not get dizzy or light-headed.  If you are in pain, you may need to take or ask for pain medication before doing incentive spirometry. It is harder to take a deep breath if you are having pain. AFTER USE  Rest and breathe slowly and easily.  It can be helpful to keep track of a log of your progress. Your caregiver can provide you with a simple table to help with this. If you are using the spirometer at home, follow these instructions: [place] IF:   You are having difficultly using the spirometer.  You have trouble using the spirometer as often as instructed.  Your pain medication is not giving enough relief while using the spirometer.  You develop fever of 100.5 F (38.1 C) or higher. SEEK IMMEDIATE MEDICAL CARE IF:   You cough up bloody sputum that had not been present before.  You develop fever of 102 F (38.9 C) or greater.  You develop worsening pain at or near the incision site. MAKE SURE YOU:   Understand these instructions.  Will watch your condition.  Will get  help right away if you are not doing well or get worse. Document Released: 07/23/2006 Document Revised: 06/04/2011 Document Reviewed: 09/23/2006 ExitCare Patient Information 2014 ExitCare, [place].   ________________________________________________________________________  WHAT IS A BLOOD TRANSFUSION? Blood Transfusion Information  A transfusion is the replacement of blood or some of its parts. Blood is made up of multiple cells which provide different functions.  Red blood cells carry oxygen and are used for blood loss replacement.  White blood cells fight against infection.  Platelets control bleeding.  Plasma helps clot blood.  Other blood products are available for specialized needs, such as hemophilia or other clotting disorders. BEFORE THE TRANSFUSION  Who gives blood for transfusions?   Healthy volunteers who are fully evaluated to make sure their blood is safe. This is blood bank blood. Transfusion therapy is the safest it has ever been in the practice of medicine. Before blood is taken from a donor, a complete history is taken to make sure that person has no history of diseases nor engages in risky social behavior (examples are intravenous drug use or sexual activity with multiple partners). The donor's travel history is screened to minimize risk of transmitting infections, such as malaria. The donated blood is tested for signs of infectious diseases, such as HIV and hepatitis. The blood is then tested to be sure it is compatible  with you in order to minimize the chance of a transfusion reaction. If you or a relative donates blood, this is often done in anticipation of surgery and is not appropriate for emergency situations. It takes many days to process the donated blood. RISKS AND COMPLICATIONS Although transfusion therapy is very safe and saves many lives, the main dangers of transfusion include:   Getting an infectious disease.  Developing a transfusion reaction. This is an  allergic reaction to something in the blood you were given. Every precaution is taken to prevent this. The decision to have a blood transfusion has been considered carefully by your caregiver before blood is given. Blood is not given unless the benefits outweigh the risks. AFTER THE TRANSFUSION  Right after receiving a blood transfusion, you will usually feel much better and more energetic. This is especially true if your red blood cells have gotten low (anemic). The transfusion raises the level of the red blood cells which carry oxygen, and this usually causes an energy increase.  The nurse administering the transfusion will monitor you carefully for complications. HOME CARE INSTRUCTIONS  No special instructions are needed after a transfusion. You may find your energy is better. Speak with your caregiver about any limitations on activity for underlying diseases you may have. SEEK MEDICAL CARE IF:   Your condition is not improving after your transfusion.  You develop redness or irritation at the intravenous (IV) site. SEEK IMMEDIATE MEDICAL CARE IF:  Any of the following symptoms occur over the next 12 hours:  Shaking chills.  You have a temperature by mouth above 102 F (38.9 C), not controlled by medicine.  Chest, back, or muscle pain.  People around you feel you are not acting correctly or are confused.  Shortness of breath or difficulty breathing.  Dizziness and fainting.  You get a rash or develop hives.  You have a decrease in urine output.  Your urine turns a dark color or changes to pink, red, or brown. Any of the following symptoms occur over the next 10 days:  You have a temperature by mouth above 102 F (38.9 C), not controlled by medicine.  Shortness of breath.  Weakness after normal activity.  The white part of the eye turns yellow (jaundice).  You have a decrease in the amount of urine or are urinating less often.  Your urine turns a dark color or changes  to pink, red, or brown. Document Released: 03/09/2000 Document Revised: 06/04/2011 Document Reviewed: 10/27/2007 [facility] Patient Information 2014 [place], [place].  _______________________________________________________________________

## 2019-03-16 ENCOUNTER — Encounter (HOSPITAL_COMMUNITY)
Admission: RE | Admit: 2019-03-16 | Discharge: 2019-03-16 | Disposition: A | Discharge status: Home / Self Care | Payer: 59 | Source: Ambulatory Visit | Attending: General Surgery | Admitting: General Surgery

## 2019-03-16 ENCOUNTER — Encounter (HOSPITAL_COMMUNITY): Payer: Self-pay

## 2019-03-16 ENCOUNTER — Other Ambulatory Visit: Payer: Self-pay

## 2019-03-17 ENCOUNTER — Ambulatory Visit: Payer: Self-pay | Admitting: General Surgery

## 2019-03-17 ENCOUNTER — Encounter (HOSPITAL_COMMUNITY)
Admission: RE | Admit: 2019-03-17 | Discharge: 2019-03-17 | Disposition: A | Discharge status: Home / Self Care | Payer: 59 | Source: Ambulatory Visit | Attending: General Surgery | Admitting: General Surgery

## 2019-03-17 ENCOUNTER — Ambulatory Visit (HOSPITAL_COMMUNITY)
Admission: RE | Admit: 2019-03-17 | Discharge: 2019-03-17 | Disposition: A | Discharge status: Home / Self Care | Payer: 59 | Source: Ambulatory Visit | Attending: General Surgery | Admitting: General Surgery

## 2019-03-17 ENCOUNTER — Other Ambulatory Visit (HOSPITAL_COMMUNITY): Payer: Self-pay | Admitting: General Surgery

## 2019-03-17 DIAGNOSIS — Z8616 Personal history of COVID-19: Secondary | ICD-10-CM

## 2019-03-17 LAB — COMPREHENSIVE METABOLIC PANEL
ALT: 26 U/L (ref 0–44)
AST: 17 U/L (ref 15–41)
Albumin: 3.5 g/dL (ref 3.5–5.0)
Alkaline Phosphatase: 45 U/L (ref 38–126)
Anion gap: 9 (ref 5–15)
BUN: 16 mg/dL (ref 6–20)
CO2: 22 mmol/L (ref 22–32)
Calcium: 8.9 mg/dL (ref 8.9–10.3)
Chloride: 106 mmol/L (ref 98–111)
Creatinine, Ser: 0.78 mg/dL (ref 0.44–1.00)
GFR calc Af Amer: >60 mL/min (ref >60)
GFR calc non Af Amer: >60 mL/min (ref >60)
Glucose, Bld: 94 mg/dL (ref 70–99)
Potassium: 3.8 mmol/L (ref 3.5–5.1)
Sodium: 137 mmol/L (ref 135–145)
Total Bilirubin: 0.8 mg/dL (ref 0.3–1.2)
Total Protein: 7.3 g/dL (ref 6.5–8.1)

## 2019-03-17 LAB — CBC WITH DIFFERENTIAL/PLATELET
Abs Immature Granulocytes: 0.02 10*3/uL (ref 0.00–0.07)
Basophils Absolute: 0 10*3/uL (ref 0.0–0.1)
Basophils Relative: 1 %
Eosinophils Absolute: 0.2 10*3/uL (ref 0.0–0.5)
Eosinophils Relative: 4 %
HCT: 39 % (ref 36.0–46.0)
Hemoglobin: 12.3 g/dL (ref 12.0–15.0)
Immature Granulocytes: 0 %
Lymphocytes Relative: 33 %
Lymphs Abs: 2.1 10*3/uL (ref 0.7–4.0)
MCH: 29.1 pg (ref 26.0–34.0)
MCHC: 31.5 g/dL (ref 30.0–36.0)
MCV: 92.2 fL (ref 80.0–100.0)
Monocytes Absolute: 0.6 10*3/uL (ref 0.1–1.0)
Monocytes Relative: 9 %
Neutro Abs: 3.4 10*3/uL (ref 1.7–7.7)
Neutrophils Relative %: 53 %
Platelets: 229 10*3/uL (ref 150–400)
RBC: 4.23 MIL/uL (ref 3.87–5.11)
RDW: 12.8 % (ref 11.5–15.5)
WBC: 6.4 10*3/uL (ref 4.0–10.5)
nRBC: 0 % (ref 0.0–0.2)

## 2019-03-17 LAB — ABO/RH: ABO/RH(D): B POS

## 2019-03-21 ENCOUNTER — Inpatient Hospital Stay (HOSPITAL_COMMUNITY): Admission: RE | Admit: 2019-03-21 | Payer: 59 | Source: Ambulatory Visit

## 2019-03-21 NOTE — Progress Notes (Signed)
Patient was Covid + on 02/26/2019 @ [facility], resulte in care everywhere.  Per policy don't retest for 90 days.

## 2019-03-23 MED ORDER — BUPIVACAINE LIPOSOME 1.3 % IJ SUSP
20.0000 mL | Freq: Once | INTRAMUSCULAR | Status: DC
  Filled 2019-03-23: qty 20

## 2019-03-24 ENCOUNTER — Other Ambulatory Visit: Payer: Self-pay

## 2019-03-24 ENCOUNTER — Encounter (HOSPITAL_COMMUNITY)
Admission: RE | Disposition: A | Discharge status: Home / Self Care | Payer: Self-pay | Source: Home / Self Care | Attending: General Surgery

## 2019-03-24 ENCOUNTER — Inpatient Hospital Stay (HOSPITAL_COMMUNITY): Payer: 59 | Admitting: Certified Registered"

## 2019-03-24 ENCOUNTER — Encounter (HOSPITAL_COMMUNITY): Payer: Self-pay | Admitting: General Surgery

## 2019-03-24 ENCOUNTER — Inpatient Hospital Stay (HOSPITAL_COMMUNITY)
Admission: RE | Admit: 2019-03-24 | Discharge: 2019-03-25 | DRG: 621 | Disposition: A | Discharge status: Home / Self Care | Payer: 59 | Attending: General Surgery | Admitting: General Surgery

## 2019-03-24 ENCOUNTER — Inpatient Hospital Stay (HOSPITAL_COMMUNITY): Payer: 59 | Admitting: Physician Assistant

## 2019-03-24 DIAGNOSIS — N920 Excessive and frequent menstruation with regular cycle: Secondary | ICD-10-CM | POA: Diagnosis present

## 2019-03-24 DIAGNOSIS — Z8619 Personal history of other infectious and parasitic diseases: Secondary | ICD-10-CM | POA: Diagnosis not present

## 2019-03-24 DIAGNOSIS — Z9884 Bariatric surgery status: Secondary | ICD-10-CM

## 2019-03-24 DIAGNOSIS — Z833 Family history of diabetes mellitus: Secondary | ICD-10-CM

## 2019-03-24 DIAGNOSIS — E669 Obesity, unspecified: Secondary | ICD-10-CM | POA: Diagnosis present

## 2019-03-24 DIAGNOSIS — Z87891 Personal history of nicotine dependence: Secondary | ICD-10-CM | POA: Diagnosis not present

## 2019-03-24 DIAGNOSIS — K760 Fatty (change of) liver, not elsewhere classified: Secondary | ICD-10-CM | POA: Diagnosis present

## 2019-03-24 DIAGNOSIS — Z888 Allergy status to other drugs, medicaments and biological substances status: Secondary | ICD-10-CM | POA: Diagnosis not present

## 2019-03-24 DIAGNOSIS — Z8249 Family history of ischemic heart disease and other diseases of the circulatory system: Secondary | ICD-10-CM | POA: Diagnosis not present

## 2019-03-24 DIAGNOSIS — M25562 Pain in left knee: Secondary | ICD-10-CM | POA: Diagnosis present

## 2019-03-24 DIAGNOSIS — G4733 Obstructive sleep apnea (adult) (pediatric): Secondary | ICD-10-CM

## 2019-03-24 DIAGNOSIS — R748 Abnormal levels of other serum enzymes: Secondary | ICD-10-CM | POA: Diagnosis present

## 2019-03-24 DIAGNOSIS — Z79899 Other long term (current) drug therapy: Secondary | ICD-10-CM

## 2019-03-24 DIAGNOSIS — Z818 Family history of other mental and behavioral disorders: Secondary | ICD-10-CM

## 2019-03-24 DIAGNOSIS — Z791 Long term (current) use of non-steroidal anti-inflammatories (NSAID): Secondary | ICD-10-CM | POA: Diagnosis not present

## 2019-03-24 DIAGNOSIS — Z8616 Personal history of COVID-19: Secondary | ICD-10-CM | POA: Diagnosis present

## 2019-03-24 DIAGNOSIS — Z8041 Family history of malignant neoplasm of ovary: Secondary | ICD-10-CM

## 2019-03-24 DIAGNOSIS — J45909 Unspecified asthma, uncomplicated: Secondary | ICD-10-CM | POA: Diagnosis present

## 2019-03-24 DIAGNOSIS — Z91013 Allergy to seafood: Secondary | ICD-10-CM | POA: Diagnosis not present

## 2019-03-24 DIAGNOSIS — Z7984 Long term (current) use of oral hypoglycemic drugs: Secondary | ICD-10-CM | POA: Diagnosis not present

## 2019-03-24 DIAGNOSIS — Z6841 Body Mass Index (BMI) 40.0 and over, adult: Secondary | ICD-10-CM | POA: Diagnosis not present

## 2019-03-24 DIAGNOSIS — Z800 Family history of malignant neoplasm of digestive organs: Secondary | ICD-10-CM

## 2019-03-24 DIAGNOSIS — E786 Lipoprotein deficiency: Secondary | ICD-10-CM | POA: Diagnosis present

## 2019-03-24 DIAGNOSIS — D649 Anemia, unspecified: Secondary | ICD-10-CM | POA: Diagnosis present

## 2019-03-24 DIAGNOSIS — Z9989 Dependence on other enabling machines and devices: Secondary | ICD-10-CM

## 2019-03-24 HISTORY — PX: LAPAROSCOPIC GASTRIC SLEEVE RESECTION: SHX5895

## 2019-03-24 LAB — TYPE AND SCREEN
ABO/RH(D): B POS
Antibody Screen: NEGATIVE

## 2019-03-24 LAB — GLUCOSE, CAPILLARY: Glucose-Capillary: 153 mg/dL — ABNORMAL HIGH (ref 70–99)

## 2019-03-24 LAB — HEMOGLOBIN AND HEMATOCRIT, BLOOD
HCT: 37.9 % (ref 36.0–46.0)
Hemoglobin: 12.1 g/dL (ref 12.0–15.0)

## 2019-03-24 SURGERY — GASTRECTOMY, SLEEVE, LAPAROSCOPIC
Anesthesia: General | Site: Abdomen

## 2019-03-24 MED ORDER — LACTATED RINGERS IR SOLN
Status: DC | PRN
  Administered 2019-03-24: 1000 mL

## 2019-03-24 MED ORDER — PROMETHAZINE HCL 25 MG/ML IJ SOLN: 12.5000 mg | Freq: Four times a day (QID) | INTRAMUSCULAR | Status: DC | PRN

## 2019-03-24 MED ORDER — SIMETHICONE 80 MG PO CHEW
80.0000 mg | CHEWABLE_TABLET | Freq: Four times a day (QID) | ORAL | Status: DC | PRN
  Administered 2019-03-25: 80 mg via ORAL
  Filled 2019-03-24: qty 1

## 2019-03-24 MED ORDER — LIDOCAINE 20MG/ML (2%) 15 ML SYRINGE OPTIME
INTRAMUSCULAR | Status: DC | PRN
  Administered 2019-03-24: 1.5 mg/kg/h via INTRAVENOUS

## 2019-03-24 MED ORDER — ENOXAPARIN SODIUM 30 MG/0.3ML ~~LOC~~ SOLN
30.0000 mg | Freq: Two times a day (BID) | SUBCUTANEOUS | Status: DC
  Administered 2019-03-24 – 2019-03-25 (×2): 30 mg via SUBCUTANEOUS
  Filled 2019-03-24 (×2): qty 0.3

## 2019-03-24 MED ORDER — ONDANSETRON HCL 4 MG/2ML IJ SOLN
INTRAMUSCULAR | Status: DC | PRN
  Administered 2019-03-24: 4 mg via INTRAVENOUS

## 2019-03-24 MED ORDER — DEXAMETHASONE SODIUM PHOSPHATE 4 MG/ML IJ SOLN: 4.0000 mg | INTRAMUSCULAR | Status: DC

## 2019-03-24 MED ORDER — PROPOFOL 10 MG/ML IV BOLUS
INTRAVENOUS | Status: AC
  Filled 2019-03-24: qty 20

## 2019-03-24 MED ORDER — ACETAMINOPHEN 160 MG/5ML PO SOLN: 325.0000 mg | Freq: Once | ORAL | Status: DC | PRN

## 2019-03-24 MED ORDER — SODIUM CHLORIDE 0.9 % IV SOLN
2.0000 g | INTRAVENOUS | Status: AC
  Administered 2019-03-24: 10:00:00 2 g via INTRAVENOUS
  Filled 2019-03-24: qty 2

## 2019-03-24 MED ORDER — CHLORHEXIDINE GLUCONATE 4 % EX LIQD: 60.0000 mL | Freq: Once | CUTANEOUS | Status: DC

## 2019-03-24 MED ORDER — SUGAMMADEX SODIUM 500 MG/5ML IV SOLN
INTRAVENOUS | Status: AC
  Filled 2019-03-24: qty 5

## 2019-03-24 MED ORDER — APREPITANT 40 MG PO CAPS
40.0000 mg | ORAL_CAPSULE | ORAL | Status: AC
  Administered 2019-03-24: 40 mg via ORAL
  Filled 2019-03-24: qty 1

## 2019-03-24 MED ORDER — ALBUTEROL SULFATE HFA 108 (90 BASE) MCG/ACT IN AERS
2.0000 | INHALATION_SPRAY | RESPIRATORY_TRACT | Status: DC | PRN
  Administered 2019-03-24: 2 via RESPIRATORY_TRACT
  Filled 2019-03-24: qty 6.7

## 2019-03-24 MED ORDER — PROPOFOL 10 MG/ML IV BOLUS
INTRAVENOUS | Status: AC
  Filled 2019-03-24: qty 40

## 2019-03-24 MED ORDER — LIDOCAINE HCL 2 % IJ SOLN
INTRAMUSCULAR | Status: AC
  Filled 2019-03-24: qty 20

## 2019-03-24 MED ORDER — FENTANYL CITRATE (PF) 250 MCG/5ML IJ SOLN
INTRAMUSCULAR | Status: AC
  Filled 2019-03-24: qty 5

## 2019-03-24 MED ORDER — LACTATED RINGERS IV SOLN: INTRAVENOUS | Status: DC

## 2019-03-24 MED ORDER — OXYCODONE HCL 5 MG/5ML PO SOLN
5.0000 mg | Freq: Four times a day (QID) | ORAL | Status: DC | PRN
  Administered 2019-03-24 – 2019-03-25 (×3): 5 mg via ORAL
  Filled 2019-03-24 (×3): qty 5

## 2019-03-24 MED ORDER — MORPHINE SULFATE (PF) 2 MG/ML IV SOLN: 1.0000 mg | INTRAVENOUS | Status: DC | PRN

## 2019-03-24 MED ORDER — PHENYLEPHRINE 40 MCG/ML (10ML) SYRINGE FOR IV PUSH (FOR BLOOD PRESSURE SUPPORT)
PREFILLED_SYRINGE | INTRAVENOUS | Status: DC | PRN
  Administered 2019-03-24 (×2): 120 ug via INTRAVENOUS

## 2019-03-24 MED ORDER — ROCURONIUM BROMIDE 10 MG/ML (PF) SYRINGE
PREFILLED_SYRINGE | INTRAVENOUS | Status: AC
  Filled 2019-03-24: qty 10

## 2019-03-24 MED ORDER — ENSURE MAX PROTEIN PO LIQD
2.0000 [oz_av] | ORAL | Status: DC
  Administered 2019-03-25: 06:00:00 2 [oz_av] via ORAL

## 2019-03-24 MED ORDER — ACETAMINOPHEN 500 MG PO TABS
1000.0000 mg | ORAL_TABLET | ORAL | Status: AC
  Administered 2019-03-24: 08:00:00 1000 mg via ORAL
  Filled 2019-03-24: qty 2

## 2019-03-24 MED ORDER — SODIUM CHLORIDE (PF) 0.9 % IJ SOLN
INTRAMUSCULAR | Status: DC | PRN
  Administered 2019-03-24: 50 mL

## 2019-03-24 MED ORDER — EPHEDRINE 5 MG/ML INJ
INTRAVENOUS | Status: AC
  Filled 2019-03-24: qty 10

## 2019-03-24 MED ORDER — MEPERIDINE HCL 50 MG/ML IJ SOLN: 6.2500 mg | INTRAMUSCULAR | Status: DC | PRN

## 2019-03-24 MED ORDER — LIDOCAINE 2% (20 MG/ML) 5 ML SYRINGE
INTRAMUSCULAR | Status: DC | PRN
  Administered 2019-03-24: 50 mg via INTRAVENOUS

## 2019-03-24 MED ORDER — GABAPENTIN 100 MG PO CAPS
200.0000 mg | ORAL_CAPSULE | Freq: Two times a day (BID) | ORAL | Status: DC
  Administered 2019-03-24 – 2019-03-25 (×3): 200 mg via ORAL
  Filled 2019-03-24 (×3): qty 2

## 2019-03-24 MED ORDER — SUGAMMADEX SODIUM 200 MG/2ML IV SOLN
INTRAVENOUS | Status: DC | PRN
  Administered 2019-03-24: 300 mg via INTRAVENOUS

## 2019-03-24 MED ORDER — DEXAMETHASONE SODIUM PHOSPHATE 10 MG/ML IJ SOLN
INTRAMUSCULAR | Status: AC
  Filled 2019-03-24: qty 1

## 2019-03-24 MED ORDER — FENTANYL CITRATE (PF) 100 MCG/2ML IJ SOLN: 25.0000 ug | INTRAMUSCULAR | Status: DC | PRN

## 2019-03-24 MED ORDER — ALBUTEROL SULFATE HFA 108 (90 BASE) MCG/ACT IN AERS
INHALATION_SPRAY | RESPIRATORY_TRACT | Status: DC | PRN
  Administered 2019-03-24 (×2): 2 via RESPIRATORY_TRACT

## 2019-03-24 MED ORDER — SUCCINYLCHOLINE CHLORIDE 200 MG/10ML IV SOSY
PREFILLED_SYRINGE | INTRAVENOUS | Status: AC
  Filled 2019-03-24: qty 10

## 2019-03-24 MED ORDER — ONDANSETRON HCL 4 MG/2ML IJ SOLN
4.0000 mg | Freq: Four times a day (QID) | INTRAMUSCULAR | Status: DC | PRN
  Administered 2019-03-24: 13:00:00 4 mg via INTRAVENOUS
  Filled 2019-03-24: qty 2

## 2019-03-24 MED ORDER — EPHEDRINE SULFATE-NACL 50-0.9 MG/10ML-% IV SOSY
PREFILLED_SYRINGE | INTRAVENOUS | Status: DC | PRN
  Administered 2019-03-24: 5 mg via INTRAVENOUS

## 2019-03-24 MED ORDER — PHENYLEPHRINE 40 MCG/ML (10ML) SYRINGE FOR IV PUSH (FOR BLOOD PRESSURE SUPPORT)
PREFILLED_SYRINGE | INTRAVENOUS | Status: AC
  Filled 2019-03-24: qty 10

## 2019-03-24 MED ORDER — PROPOFOL 10 MG/ML IV BOLUS
INTRAVENOUS | Status: DC | PRN
  Administered 2019-03-24: 200 mg via INTRAVENOUS

## 2019-03-24 MED ORDER — ACETAMINOPHEN 160 MG/5ML PO SOLN: 1000.0000 mg | Freq: Three times a day (TID) | ORAL | Status: DC

## 2019-03-24 MED ORDER — ACETAMINOPHEN 500 MG PO TABS
1000.0000 mg | ORAL_TABLET | Freq: Three times a day (TID) | ORAL | Status: DC
  Administered 2019-03-24 – 2019-03-25 (×4): 1000 mg via ORAL
  Filled 2019-03-24 (×4): qty 2

## 2019-03-24 MED ORDER — STERILE WATER FOR IRRIGATION IR SOLN
Status: DC | PRN
  Administered 2019-03-24: 1000 mL

## 2019-03-24 MED ORDER — LIDOCAINE 2% (20 MG/ML) 5 ML SYRINGE
INTRAMUSCULAR | Status: AC
  Filled 2019-03-24: qty 10

## 2019-03-24 MED ORDER — KETAMINE HCL 10 MG/ML IJ SOLN
INTRAMUSCULAR | Status: DC | PRN
  Administered 2019-03-24: 25 mg via INTRAVENOUS
  Administered 2019-03-24: 15 mg via INTRAVENOUS

## 2019-03-24 MED ORDER — GABAPENTIN 300 MG PO CAPS
300.0000 mg | ORAL_CAPSULE | ORAL | Status: AC
  Administered 2019-03-24: 08:00:00 300 mg via ORAL
  Filled 2019-03-24: qty 1

## 2019-03-24 MED ORDER — DEXAMETHASONE SODIUM PHOSPHATE 10 MG/ML IJ SOLN
INTRAMUSCULAR | Status: DC | PRN
  Administered 2019-03-24: 10 mg via INTRAVENOUS

## 2019-03-24 MED ORDER — SCOPOLAMINE 1 MG/3DAYS TD PT72
1.0000 | MEDICATED_PATCH | TRANSDERMAL | Status: DC
  Administered 2019-03-24: 08:00:00 1.5 mg via TRANSDERMAL
  Filled 2019-03-24: qty 1

## 2019-03-24 MED ORDER — DIPHENHYDRAMINE HCL 50 MG/ML IJ SOLN: 12.5000 mg | Freq: Three times a day (TID) | INTRAMUSCULAR | Status: DC | PRN

## 2019-03-24 MED ORDER — ACETAMINOPHEN 325 MG PO TABS: 325.0000 mg | ORAL_TABLET | Freq: Once | ORAL | Status: DC | PRN

## 2019-03-24 MED ORDER — PROMETHAZINE HCL 25 MG/ML IJ SOLN: 6.2500 mg | INTRAMUSCULAR | Status: DC | PRN

## 2019-03-24 MED ORDER — ACETAMINOPHEN 10 MG/ML IV SOLN: 1000.0000 mg | Freq: Once | INTRAVENOUS | Status: DC | PRN

## 2019-03-24 MED ORDER — HEPARIN SODIUM (PORCINE) 5000 UNIT/ML IJ SOLN
5000.0000 [IU] | INTRAMUSCULAR | Status: AC
  Administered 2019-03-24: 08:00:00 5000 [IU] via SUBCUTANEOUS
  Filled 2019-03-24: qty 1

## 2019-03-24 MED ORDER — MIDAZOLAM HCL 5 MG/5ML IJ SOLN
INTRAMUSCULAR | Status: DC | PRN
  Administered 2019-03-24: 2 mg via INTRAVENOUS

## 2019-03-24 MED ORDER — SUCCINYLCHOLINE CHLORIDE 200 MG/10ML IV SOSY
PREFILLED_SYRINGE | INTRAVENOUS | Status: DC | PRN
  Administered 2019-03-24: 140 mg via INTRAVENOUS

## 2019-03-24 MED ORDER — PANTOPRAZOLE SODIUM 40 MG IV SOLR
40.0000 mg | Freq: Every day | INTRAVENOUS | Status: DC
  Administered 2019-03-24: 40 mg via INTRAVENOUS
  Filled 2019-03-24: qty 40

## 2019-03-24 MED ORDER — KCL IN DEXTROSE-NACL 20-5-0.45 MEQ/L-%-% IV SOLN
INTRAVENOUS | Status: DC
  Filled 2019-03-24 (×3): qty 1000

## 2019-03-24 MED ORDER — SODIUM CHLORIDE (PF) 0.9 % IJ SOLN
INTRAMUSCULAR | Status: AC
  Filled 2019-03-24: qty 50

## 2019-03-24 MED ORDER — MIDAZOLAM HCL 2 MG/2ML IJ SOLN
INTRAMUSCULAR | Status: AC
  Filled 2019-03-24: qty 2

## 2019-03-24 MED ORDER — BUPIVACAINE LIPOSOME 1.3 % IJ SUSP
INTRAMUSCULAR | Status: DC | PRN
  Administered 2019-03-24: 20 mL

## 2019-03-24 MED ORDER — FENTANYL CITRATE (PF) 100 MCG/2ML IJ SOLN
INTRAMUSCULAR | Status: DC | PRN
  Administered 2019-03-24 (×5): 50 ug via INTRAVENOUS

## 2019-03-24 MED ORDER — 0.9 % SODIUM CHLORIDE (POUR BTL) OPTIME
TOPICAL | Status: DC | PRN
  Administered 2019-03-24: 1000 mL

## 2019-03-24 MED ORDER — ALBUTEROL SULFATE HFA 108 (90 BASE) MCG/ACT IN AERS
INHALATION_SPRAY | RESPIRATORY_TRACT | Status: AC
  Filled 2019-03-24: qty 6.7

## 2019-03-24 MED ORDER — ONDANSETRON HCL 4 MG/2ML IJ SOLN
INTRAMUSCULAR | Status: AC
  Filled 2019-03-24: qty 2

## 2019-03-24 MED ORDER — ROCURONIUM BROMIDE 10 MG/ML (PF) SYRINGE
PREFILLED_SYRINGE | INTRAVENOUS | Status: DC | PRN
  Administered 2019-03-24: 50 mg via INTRAVENOUS
  Administered 2019-03-24: 20 mg via INTRAVENOUS

## 2019-03-24 SURGICAL SUPPLY — 79 items
APPLICATOR COTTON TIP 6 STRL (MISCELLANEOUS) IMPLANT
APPLICATOR COTTON TIP 6IN STRL (MISCELLANEOUS)
APPLIER CLIP 5 13 M/L LIGAMAX5 (MISCELLANEOUS) ×2
APPLIER CLIP ROT 10 11.4 M/L (STAPLE)
APPLIER CLIP ROT 13.4 12 LRG (CLIP)
BENZOIN TINCTURE PRP APPL 2/3 (GAUZE/BANDAGES/DRESSINGS) ×2 IMPLANT
BLADE SURG SZ11 CARB STEEL (BLADE) ×2 IMPLANT
BNDG ADH 1X3 SHEER STRL LF (GAUZE/BANDAGES/DRESSINGS) ×12 IMPLANT
CABLE HIGH FREQUENCY MONO STRZ (ELECTRODE) ×2 IMPLANT
CHLORAPREP W/TINT 26 (MISCELLANEOUS) ×4 IMPLANT
CLIP APPLIE 5 13 M/L LIGAMAX5 (MISCELLANEOUS) ×1 IMPLANT
CLIP APPLIE ROT 10 11.4 M/L (STAPLE) IMPLANT
CLIP APPLIE ROT 13.4 12 LRG (CLIP) IMPLANT
COVER SURGICAL LIGHT HANDLE (MISCELLANEOUS) ×2 IMPLANT
COVER WAND RF STERILE (DRAPES) IMPLANT
DECANTER SPIKE VIAL GLASS SM (MISCELLANEOUS) ×2 IMPLANT
DEVICE SUT QUICK LOAD TK 5 (STAPLE) IMPLANT
DEVICE SUT TI-KNOT TK 5X26 (MISCELLANEOUS) IMPLANT
DEVICE SUTURE ENDOST 10MM (ENDOMECHANICALS) IMPLANT
DISSECTOR BLUNT TIP ENDO 5MM (MISCELLANEOUS) IMPLANT
DRAPE UTILITY XL STRL (DRAPES) ×4 IMPLANT
ELECT L-HOOK LAP 45CM DISP (ELECTROSURGICAL)
ELECT REM PT RETURN 15FT ADLT (MISCELLANEOUS) ×2 IMPLANT
ELECTRODE L-HOOK LAP 45CM DISP (ELECTROSURGICAL) IMPLANT
GAUZE SPONGE 2X2 8PLY STRL LF (GAUZE/BANDAGES/DRESSINGS) IMPLANT
GAUZE SPONGE 4X4 12PLY STRL (GAUZE/BANDAGES/DRESSINGS) IMPLANT
GLOVE BIO SURGEON STRL SZ 6.5 (GLOVE) ×2 IMPLANT
GLOVE BIO SURGEON STRL SZ7.5 (GLOVE) ×2 IMPLANT
GLOVE BIOGEL PI IND STRL 7.0 (GLOVE) ×3 IMPLANT
GLOVE BIOGEL PI INDICATOR 7.0 (GLOVE) ×3
GLOVE ECLIPSE 6.5 STRL STRAW (GLOVE) ×2 IMPLANT
GLOVE INDICATOR 6.5 STRL GRN (GLOVE) ×2 IMPLANT
GLOVE INDICATOR 8.0 STRL GRN (GLOVE) ×2 IMPLANT
GLOVE SURG SS PI 7.0 STRL IVOR (GLOVE) ×2 IMPLANT
GOWN STRL REUS W/TWL LRG LVL3 (GOWN DISPOSABLE) ×4 IMPLANT
GOWN STRL REUS W/TWL XL LVL3 (GOWN DISPOSABLE) ×4 IMPLANT
GRASPER SUT TROCAR 14GX15 (MISCELLANEOUS) ×2 IMPLANT
HEMOSTAT SURGICEL 2X3 (HEMOSTASIS) ×2 IMPLANT
HOVERMATT SINGLE USE (MISCELLANEOUS) ×2 IMPLANT
KIT BASIN OR (CUSTOM PROCEDURE TRAY) ×2 IMPLANT
KIT PROCEDURE OLYMPUS (MISCELLANEOUS) ×2 IMPLANT
KIT TURNOVER KIT A (KITS) IMPLANT
MARKER SKIN DUAL TIP RULER LAB (MISCELLANEOUS) ×2 IMPLANT
NEEDLE SPNL 22GX3.5 QUINCKE BK (NEEDLE) ×2 IMPLANT
PACK UNIVERSAL I (CUSTOM PROCEDURE TRAY) ×2 IMPLANT
PENCIL SMOKE EVACUATOR (MISCELLANEOUS) IMPLANT
RELOAD STAPLER 60MM BLK (STAPLE) IMPLANT
RELOAD STAPLER BLUE 60MM (STAPLE) ×3 IMPLANT
RELOAD STAPLER GOLD 60MM (STAPLE) ×1 IMPLANT
RELOAD STAPLER GREEN 60MM (STAPLE) ×1 IMPLANT
SCISSORS LAP 5X45 EPIX DISP (ENDOMECHANICALS) IMPLANT
SEALANT SURGICAL APPL DUAL CAN (MISCELLANEOUS) IMPLANT
SET IRRIG TUBING LAPAROSCOPIC (IRRIGATION / IRRIGATOR) ×2 IMPLANT
SET TUBE SMOKE EVAC HIGH FLOW (TUBING) ×2 IMPLANT
SHEARS HARMONIC ACE PLUS 45CM (MISCELLANEOUS) ×2 IMPLANT
SLEEVE GASTRECTOMY 40FR VISIGI (MISCELLANEOUS) ×2 IMPLANT
SLEEVE XCEL OPT CAN 5 100 (ENDOMECHANICALS) ×6 IMPLANT
SOL ANTI FOG 6CC (MISCELLANEOUS) ×1 IMPLANT
SOLUTION ANTI FOG 6CC (MISCELLANEOUS) ×1
SPONGE GAUZE 2X2 STER 10/PKG (GAUZE/BANDAGES/DRESSINGS)
SPONGE LAP 18X18 RF (DISPOSABLE) ×2 IMPLANT
STAPLER ECHELON BIOABSB 60 FLE (MISCELLANEOUS) ×10 IMPLANT
STAPLER ECHELON LONG 60 440 (INSTRUMENTS) ×2 IMPLANT
STAPLER RELOAD 60MM BLK (STAPLE)
STAPLER RELOAD BLUE 60MM (STAPLE) ×6
STAPLER RELOAD GOLD 60MM (STAPLE) ×2
STAPLER RELOAD GREEN 60MM (STAPLE) ×2
STRIP CLOSURE SKIN 1/2X4 (GAUZE/BANDAGES/DRESSINGS) ×2 IMPLANT
SUT MNCRL AB 4-0 PS2 18 (SUTURE) ×4 IMPLANT
SUT SURGIDAC NAB ES-9 0 48 120 (SUTURE) IMPLANT
SUT VICRYL 0 TIES 12 18 (SUTURE) ×2 IMPLANT
SYR 20ML LL LF (SYRINGE) ×2 IMPLANT
SYR 50ML LL SCALE MARK (SYRINGE) ×2 IMPLANT
TOWEL OR 17X26 10 PK STRL BLUE (TOWEL DISPOSABLE) ×2 IMPLANT
TOWEL OR NON WOVEN STRL DISP B (DISPOSABLE) ×2 IMPLANT
TROCAR BLADELESS 15MM (ENDOMECHANICALS) ×2 IMPLANT
TROCAR BLADELESS OPT 5 100 (ENDOMECHANICALS) ×2 IMPLANT
TUBING CONNECTING 10 (TUBING) ×2 IMPLANT
TUBING ENDO SMARTCAP (MISCELLANEOUS) ×2 IMPLANT

## 2019-03-24 NOTE — Op Note (Signed)
03/24/2019 Susan EstersLetisha C Faulkner Mar 10, 1981 [id number]   PRE-OPERATIVE DIAGNOSIS:     Severe obesity BMI 56   OSA on CPAP   History of 2019 novel coronavirus disease (COVID-19)   Low HDL (under 40)  POST-OPERATIVE DIAGNOSIS:  Same + fatty liver  PROCEDURE:  Procedure(s): LAPAROSCOPIC SLEEVE GASTRECTOMY  UPPER GI ENDOSCOPY  SURGEON:  Surgeon(s): Atilano InaEric M Ruhee Enck, MD FACS FASMBS  ASSISTANTS: Phylliss Blakeshelsea Connor MD FACS  ANESTHESIA:   general  DRAINS: none   BOUGIE: 40 fr ViSiGi  LOCAL MEDICATIONS USED:   Exparel  EBL: 30cc  SPECIMEN:  Source of Specimen:  Greater curvature of stomach  DISPOSITION OF SPECIMEN:  PATHOLOGY  COUNTS:  YES  INDICATION FOR PROCEDURE: This is a very pleasant 38 y.o.-year-old morbidly obese female who has had unsuccessful attempts for sustained weight loss. The patient presents today for a planned laparoscopic sleeve gastrectomy with upper endoscopy. We have discussed the risk and benefits of the procedure extensively preoperatively. Please see my separate notes.  PROCEDURE: After obtaining informed consent and receiving 5000 units of subcutaneous heparin, the patient was brought to the operating [facility] and placed supine on the operating room table. General endotracheal anesthesia was established. Sequential compression devices were placed. A orogastric tube was placed. The patient's abdomen was prepped and draped in the usual standard surgical fashion. The patient received preoperative IV antibiotic. A surgical timeout was performed. ERAS protocol used.   Access to the abdomen was achieved using a 5 mm 0 laparoscope thru a 5 mm trocar In the left upper Quadrant 2 fingerbreadths below the left subcostal margin using the Optiview technique. Pneumoperitoneum was smoothly established up to 15 mm of mercury. The laparoscope was advanced and the abdominal cavity was surveilled. The patient was then placed in reverse Trendelenburg.   A 5 mm trocar  was placed slightly above and to the left of the umbilicus under direct visualization.  [facility] was placed under the left lobe of the liver through a 5 mm trocar incision site in the subxiphoid position.  The liver was fairly bulky and fatty consistent with hepatic steatosis.  We had to use a small liver retractor in order to navigate it underneath her rib cage.  My assistant was required in order to help navigate the liver retractor underneath the left hepatic lobe.  And lifting up the left hepatic lobe there was a liver capsule tear in 2 locations with some resultant bleeding.  This is not unexpected occurrence given her severe hepatic steatosis.  Hemostasis was achieved with electrocautery.  A 5 mm trocar was placed in the lateral right upper quadrant along with a 15 mm trocar in the mid right abdomen. A final 5 mm trocar was placed in the lateral LUQ.  All under direct visualization after exparel had been infiltrated in bilateral lateral upper abdominal walls as a TAP block.  The stomach was inspected. It was completely decompressed and the orogastric tube was removed.  There was no small anterior dimple that was obviously visible. Her preop UGI showed no hiatal hernia.  We identified the pylorus and measured 6 cm proximal to the pylorus and identified an area of where we would start taking down the short gastric vessels. Harmonic scalpel was used to take down the short gastric vessels along the greater curvature of the stomach. We were able to enter the lesser sac. We continued to march along the greater curvature of the stomach taking down the short gastrics. As  we approached the gastrosplenic ligament we took care in this area not to injure the spleen. We were able to take down the entire gastrosplenic ligament. We then mobilized the fundus away from the left crus of diaphragm. There were not any significant posterior gastric avascular attachments. This left the stomach completely  mobilized. No vessels had been taken down along the lesser curvature of the stomach.  We then reidentified the pylorus. A 40Fr ViSiGi was then placed in the oropharynx and advanced down into the stomach and placed in the distal antrum and positioned along the lesser curvature. It was placed under suction which secured the 40Fr ViSiGi in place along the lesser curve. Then using the [employer] echelon 60 mm stapler with a green load with Seamguard, I placed a stapler along the antrum approximately 5 cm from the pylorus. The stapler was angled so that there is ample room at the angularis incisura. I then fired the first staple load after inspecting it posteriorly to ensure adequate space both anteriorly and posteriorly. At this point I still was not completely past the angularis so with a gold load with Seamguard, I placed the stapler in position just inside the prior stapleline. We then rotated the stomach to insure that there was adequate anteriorly as well as posteriorly. The stapler was then fired.  At this point I started using 60 mm blue load staple cartridges with Seamguard. The echelon stapler was then repositioned with a 60 mm blue load with Seamguard and we continued to march up along the [place]. My assistant was holding traction along the greater curvature stomach along the cauterized short gastric vessels ensuring that the stomach was symmetrically retracted. Prior to each firing of the staple, we rotated the stomach to ensure that there is adequate stomach left.  As we approached the fundus, I used 60 mm blue cartridge with Seamguard aiming  lateral to the GE junction after mobilizing some of the esophageal fat pad.  The sleeve was inspected. There is no evidence of cork screw. The staple line appeared hemostatic. The CRNA inflated the ViSiGi to the green zone and the upper abdomen was flooded with saline. There were no bubbles. The sleeve was decompressed and the ViSiGi removed. My assistant scrubbed out  and performed an upper endoscopy. The sleeve easily distended with air and the scope was easily advanced to the pylorus. There is no evidence of internal bleeding or cork screwing. There was no narrowing at the angularis. There is no evidence of bubbles. Please see her operative note for further details. The gastric sleeve was decompressed and the endoscope was removed.  The greater curvature the stomach was grasped with a laparoscopic grasper and removed from the 15 mm trocar site.  Reinspected the sleeve gastrectomy staple line.  There was a little bit of oozing just at the antrum at the beginning of the staple line.  2 clips were placed and hemostasis was achieved.  Had placed a piece of surgical snow underneath the left hepatic lobe after inspecting the area and there is no signs of additional bleeding.  the liver retractor was removed. I then closed the 15 mm trocar site with 1 interrupted 0 Vicryl sutures through the fascia using the endoclose. The closure was viewed laparoscopically and it was airtight. Remaining Exparel was then infiltrated in the preperitoneal spaces around the trocar sites. Pneumoperitoneum was released. All trocar sites were closed with a 4-0 Monocryl in a subcuticular fashion followed by the application of benzoin, steri-strips, and bandaids.  The patient was extubated and taken to the recovery room in stable condition. All needle, instrument, and sponge counts were correct x2. There are no immediate complications  (1) 60 mm green with Seamguard (1) 60 mm gold with seamguard (4) 60 mm blue with  seamguard  PLAN OF CARE: Admit to inpatient   PATIENT DISPOSITION:  PACU - hemodynamically stable.   Delay start of Pharmacological VTE agent (>24hrs) due to surgical blood loss or risk of bleeding:  no  Mary Sella. [name], MD, FACS FASMBS General, Bariatric, & Minimally Invasive Surgery [facility] Surgery, [place]

## 2019-03-24 NOTE — Interval H&P Note (Signed)
History and Physical Interval Note:  03/24/2019 9:16 AM  Susan Faulkner  has presented today for surgery, with the diagnosis of Morbid Obesity, OSA.  The various methods of treatment have been discussed with the patient and family. After consideration of risks, benefits and other options for treatment, the patient has consented to  Procedure(s): LAPAROSCOPIC GASTRIC SLEEVE RESECTION, Upper Endo, ERAS Pathway (N/A) as a surgical intervention.  The patient's history has been reviewed, patient examined, no change in status, stable for surgery.  I have reviewed the patient's chart and labs.  Questions were answered to the patient's satisfaction.    Had covid early December Currently not symptomatic - no fever, no sob, no fatigue; denies other symptoms.  Labs ok Discussed with pulm attending who felt it was safe to proceed  Greer Pickerel

## 2019-03-24 NOTE — Progress Notes (Signed)
Discussed post op day goals with patient including ambulation, IS, diet progression, pain, and nausea control.  BSTOP education provided including BSTOP information guide, "Guide for Pain Management after your Bariatric Procedure".  Questions answered. 

## 2019-03-24 NOTE — Anesthesia Procedure Notes (Signed)
Procedure Name: Intubation Date/Time: 03/24/2019 9:41 AM Performed by: Silas Sacramento, CRNA Pre-anesthesia Checklist: Patient identified, Emergency Drugs available, Suction available and Patient being monitored Patient Re-evaluated:Patient Re-evaluated prior to induction Oxygen Delivery Method: Circle system utilized Preoxygenation: Pre-oxygenation with 100% oxygen Induction Type: IV induction, Rapid sequence and Cricoid Pressure applied Grade View: Grade I Tube type: Oral Tube size: 7.0 mm Number of attempts: 1 Airway Equipment and Method: Stylet Placement Confirmation: ETT inserted through vocal cords under direct vision,  positive ETCO2 and breath sounds checked- equal and bilateral Secured at: 22 cm Tube secured with: Tape Dental Injury: Teeth and Oropharynx as per pre-operative assessment

## 2019-03-24 NOTE — Op Note (Signed)
Preoperative diagnosis: laparoscopic sleeve gastrectomy  Postoperative diagnosis: Same   Procedure: Upper endoscopy   Surgeon: Marcey Persad A Najae Filsaime, M.D.  Anesthesia: Gen.   Description of procedure: The endoscopy was placed in the mouth and into the oropharynx and under endoscopic vision it was advanced to the esophagogastric junction which was identified at 36 from the teeth.  The pouch was tensely insufflated while the upper abdomen was flooded with irrigation to perform a leak test, which was negative. No bubbles were seen.  The staple line was hemostatic and the lumen was evenly tubular without undue narrowing, angulation or twisting specifically at the incisura angularis. The lumen was decompressed and the scope was withdrawn without difficulty.    Rosalene Wardrop A Macauley Mossberg, M.D. General, Bariatric, & Minimally Invasive Surgery [facility]    

## 2019-03-24 NOTE — Anesthesia Postprocedure Evaluation (Signed)
Anesthesia Post Note  Patient: Susan Faulkner  Procedure(s) Performed: LAPAROSCOPIC GASTRIC SLEEVE RESECTION, Upper Endo, ERAS Pathway (N/A Abdomen)     Patient location during evaluation: PACU Anesthesia Type: General Level of consciousness: awake and alert Pain management: pain level controlled Vital Signs Assessment: post-procedure vital signs reviewed and stable Respiratory status: spontaneous breathing, nonlabored ventilation, respiratory function stable and patient connected to nasal cannula oxygen Cardiovascular status: blood pressure returned to baseline and stable Postop Assessment: no apparent nausea or vomiting Anesthetic complications: no    Last Vitals:  Vitals:   03/24/19 1230 03/24/19 1256  BP: 136/70 (!) 167/85  Pulse: 89 92  Resp: 19 18  Temp: (!) 36.3 C 36.8 C  SpO2: 99% 99%    Last Pain:  Vitals:   03/24/19 1256  TempSrc: Oral  PainSc:                  Effie Berkshire

## 2019-03-24 NOTE — Discharge Instructions (Signed)
° ° ° °GASTRIC BYPASS/SLEEVE ° Home Care Instructions ° ° These instructions are to help you care for yourself when you go home. ° °Call: If you have any problems. °• Call [phone] and ask for the surgeon on call °• If you need immediate help, come to the ER at [facility].  °• Tell the ER staff that you are a new post-op gastric bypass or gastric sleeve patient °  °Signs and symptoms to report: • Severe vomiting or nausea °o If you cannot keep down clear liquids for longer than 1 day, call your surgeon  °• Abdominal pain that does not get better after taking your pain medication °• Fever over 100.4° F with chills °• Heart beating over 100 beats a minute °• Shortness of breath at rest °• Chest pain °•  Redness, swelling, drainage, or foul odor at incision (surgical) sites °•  If your incisions open or pull apart °• Swelling or pain in calf (lower leg) °• Diarrhea (Loose bowel movements that happen often), frequent watery, uncontrolled bowel movements °• Constipation, (no bowel movements for 3 days) if this happens: Pick one °o Milk of Magnesia, 2 tablespoons by mouth, 3 times a day for 2 days if needed °o Stop taking Milk of Magnesia once you have a bowel movement °o Call your doctor if constipation continues °Or °o Miralax  (instead of Milk of Magnesia) following the label instructions °o Stop taking Miralax once you have a bowel movement °o Call your doctor if constipation continues °• Anything you think is not normal °  °Normal side effects after surgery: • Unable to sleep at night or unable to focus °• Irritability or moody °• Being tearful (crying) or depressed °These are common complaints, possibly related to your anesthesia medications that put you to sleep, stress of surgery, and change in lifestyle.  This usually goes away a few weeks after surgery.  If these feelings continue, call your primary care doctor. °  °Wound Care: You may have surgical glue, steri-strips, or staples over your incisions after  surgery °• Surgical glue:  Looks like a clear film over your incisions and will wear off a little at a time °• Steri-strips: Strips of tape over your incisions. You may notice a yellowish color on the skin under the steri-strips. This is used to make the   steri-strips stick better. Do not pull the steri-strips off - let them fall off °• Staples: Staples may be removed before you leave the hospital °o If you go home with staples, call [facility], [phone] at for an appointment with your surgeon’s nurse to have staples removed 10 days after surgery. °• Showering: You may shower two (2) days after your surgery unless your surgeon tells you differently °o Wash gently around incisions with warm soapy water, rinse well, and gently pat dry  °o No tub baths until staples are removed, steri-strips fall off or glue is gone.  °  °Medications: • Medications should be liquid or crushed if larger than the size of a dime °• Extended release pills (medication that release a little bit at a time through the day) should NOT be crushed or cut. (examples include XL, ER, DR, SR) °• Depending on the size and number of medications you take, you may need to space (take a few throughout the day)/change the time you take your medications so that you do not over-fill your pouch (smaller stomach) °• Make sure you follow-up with your primary care doctor to   make medication changes needed during rapid weight loss and life-style changes °• If you have diabetes, follow up with the doctor that orders your diabetes medication(s) within one week after surgery and check your blood sugar regularly. °• Do not drive while taking prescription pain medication  °• It is ok to take Tylenol by the bottle instructions with your pain medicine or instead of your pain medicine as needed.  DO NOT TAKE NSAIDS (EXAMPLES OF NSAIDS:  IBUPROFREN/ NAPROXEN)  °Diet:                    First 2 Weeks ° You will see the dietician t about two (2) weeks  after your surgery. The dietician will increase the types of foods you can eat if you are handling liquids well: °• If you have severe vomiting or nausea and cannot keep down clear liquids lasting longer than 1 day, call your surgeon @ ([phone]) °Protein Shake °• Drink at least 2 ounces of shake 5-6 times per day °• Each serving of protein shakes (usually 8 - 12 ounces) should have: °o 15 grams of protein  °o And no more than 5 grams of carbohydrate  °• Goal for protein each day: °o Men = 80 grams per day °o Women = 60 grams per day °• Protein powder may be added to fluids such as non-fat milk or Lactaid milk or unsweetened Soy/Almond milk (limit to 35 grams added protein powder per serving) ° °Hydration °• Slowly increase the amount of water and other clear liquids as tolerated (See Acceptable Fluids) °• Slowly increase the amount of protein shake as tolerated  °•  Sip fluids slowly and throughout the day.  Do not use straws. °• May use sugar substitutes in small amounts (no more than 6 - 8 packets per day; i.e. Splenda) ° °Fluid Goal °• The first goal is to drink at least 8 ounces of protein shake/drink per day (or as directed by the nutritionist); some examples of protein shakes are Syntrax Nectar, Adkins Advantage, EAS Edge HP, and Unjury. See handout from pre-op Bariatric Education Class: °o Slowly increase the amount of protein shake you drink as tolerated °o You may find it easier to slowly sip shakes throughout the day °o It is important to get your proteins in first °• Your fluid goal is to drink 64 - 100 ounces of fluid daily °o It may take a few weeks to build up to this °• 32 oz (or more) should be clear liquids  °And  °• 32 oz (or more) should be full liquids (see below for examples) °• Liquids should not contain sugar, caffeine, or carbonation ° °Clear Liquids: °• Water or Sugar-free flavored water (i.e. Fruit H2O, Propel) °• Decaffeinated coffee or tea (sugar-free) °• Crystal Lite, Wyler’s Lite,  Minute Maid Lite °• Sugar-free Jell-O °• Bouillon or broth °• Sugar-free Popsicle:   *Less than 20 calories each; Limit 1 per day ° °Full Liquids: °Protein Shakes/Drinks + 2 choices per day of other full liquids °• Full liquids must be: °o No More Than 15 grams of Carbs per serving  °o No More Than 3 grams of Fat per serving °• Strained low-fat cream soup (except Cream of Potato or Tomato) °• Non-Fat milk °• Fat-free Lactaid Milk °• Unsweetened Soy Or Unsweetened Almond Milk °• Low Sugar yogurt (Dannon Lite & Fit, Greek yogurt; Oikos Triple Zero; Chobani Simply 100; Yoplait 100 calorie Greek - No Fruit on the Bottom) ° °  °Vitamins   and Minerals • Start 1 day after surgery unless otherwise directed by your surgeon °• 2 Chewable Bariatric Specific Multivitamin / Multimineral Supplement with iron (Example: Bariatric Advantage Multi EA) °• Chewable Calcium with Vitamin D-3 °(Example: 3 Chewable Calcium Plus 600 with Vitamin D-3) °o Take 500 mg three (3) times a day for a total of 1500 mg each day °o Do not take all 3 doses of calcium at one time as it may cause constipation, and you can only absorb 500 mg  at a time  °o Do not mix multivitamins containing iron with calcium supplements; take 2 hours apart °• Menstruating women and those with a history of anemia (a blood disease that causes weakness) may need extra iron °o Talk with your doctor to see if you need more iron °• Do not stop taking or change any vitamins or minerals until you talk to your dietitian or surgeon °• Your Dietitian and/or surgeon must approve all vitamin and mineral supplements °  °Activity and Exercise: Limit your physical activity as instructed by your doctor.  It is important to continue walking at home.  During this time, use these guidelines: °• Do not lift anything greater than ten (10) pounds for at least two (2) weeks °• Do not go back to work or drive until your surgeon says you can °• You may have sex when you feel comfortable  °o It is  VERY important for female patients to use a reliable birth control method; fertility often increases after surgery  °o All hormonal birth control will be ineffective for 30 days after surgery due to medications given during surgery a barrier method must be used. °o Do not get pregnant for at least 18 months °• Start exercising as soon as your doctor tells you that you can °o Make sure your doctor approves any physical activity °• Start with a simple walking program °• Walk 5-15 minutes each day, 7 days per week.  °• Slowly increase until you are walking 30-45 minutes per day °Consider joining our BELT program. [phone] or email [email or web address] °  °Special Instructions Things to remember: °• Use your CPAP when sleeping if this applies to you ° °• [facility] has two free Bariatric Surgery Support Groups that meet monthly °o The 3rd Thursday of each month, 6 pm, [facility]  °o The 2nd Friday of each month, 11:45 am in the private dining room in the basement of [facility] °• It is very important to keep all follow up appointments with your surgeon, dietitian, primary care physician, and behavioral health practitioner °• Routine follow up schedule with your surgeon include appointments at 2-3 weeks, 6-8 weeks, 6 months, and 1 year at a minimum.  Your surgeon may request to see you more often.   °o After the first year, please follow up with your bariatric surgeon and dietitian at least once a year in order to maintain best weight loss results °[facility]: [phone] °[facility]: [phone] °Bariatric Nurse Coordinator: [phone] °  °   Reviewed and Endorsed  °by [facility] Patient Education Committee, June, 2016 °Edits Approved: Aug, 2018 ° ° ° °

## 2019-03-24 NOTE — Progress Notes (Signed)
Patient is on her 4th cup of water and has demonstrated IS. Pt has ambulated around the unit tonight and is walking independently to the restroom. Pain is controlled. No other needs expressed. Will continue to monitor.

## 2019-03-24 NOTE — Progress Notes (Signed)
PHARMACY CONSULT FOR:  Risk Assessment for Post-Discharge VTE Following Bariatric Surgery  Post-Discharge VTE Risk Assessment: This patient's probability of 30-day post-discharge VTE is increased due to the factors marked:   Female    Age >/=60 years   X BMI >/=50 kg/m2    CHF    Dyspnea at Rest    Paraplegia    Non-gastric-band surgery    Operation Time >/=3 hr    Return to OR     Length of Stay >/= 3 d      Hx of VTE   Hypercoagulable condition   Significant venous stasis   Predicted probability of 30-day post-discharge VTE: 0.27%  Other patient-specific factors to consider: Asthma, OSA   Recommendation for Discharge: No pharmacologic prophylaxis post-discharge  Susan Faulkner is a 38 y.o. female who underwent lap gastric sleeve on 03/24/2019. Procedure was 1 hr long.   Allergies  Allergen Reactions  . Shellfish Allergy Anaphylaxis and Swelling  . Iodine Other (See Comments)    No known reaction - was told to say she's allergic due to the reaction with shellfish    Patient Measurements: Weight: (!) 314 lb 11.2 oz (142.7 kg) Body mass index is 55.75 kg/m.  No results for input(s): WBC, HGB, HCT, PLT, APTT, CREATININE, LABCREA, CREATININE, CREAT24HRUR, MG, PHOS, ALBUMIN, PROT, ALBUMIN, AST, ALT, ALKPHOS, BILITOT, BILIDIR, IBILI in the last 72 hours. Estimated Creatinine Clearance: 135.8 mL/min (by C-G formula based on SCr of 0.78 mg/dL).   Past Medical History:  Diagnosis Date  . Asthma   . Ectopic pregnancy 2013   2    Medications Prior to Admission  Medication Sig Dispense Refill Last Dose  . albuterol (PROVENTIL HFA;VENTOLIN HFA) 108 (90 BASE) MCG/ACT inhaler Inhale 2 puffs into the lungs every 4 (four) hours as needed for wheezing or shortness of breath.   03/24/2019 at 0530  . ibuprofen (ADVIL) 800 MG tablet Take 800 mg by mouth every 8 (eight) hours as needed for moderate pain.   03/23/2019 at Unknown time  . metFORMIN (GLUCOPHAGE XR) 500 MG 24 hr tablet  Take 3 tablets (1,500 mg total) by mouth daily after supper. (Patient not taking: Reported on 03/16/2019) 90 tablet 11   . metroNIDAZOLE (FLAGYL) 500 MG tablet Take 1 tablet (500 mg total) by mouth 2 (two) times daily. (Patient not taking: Reported on 03/23/2019) 14 tablet 2 Not Taking at Unknown time  . prenatal vitamin w/FE, FA (PRENATAL 1 + 1) 27-1 MG TABS tablet Take 1 tablet by mouth daily before breakfast. (Patient not taking: Reported on 03/23/2019) 30 each 11 Not Taking at Unknown time   Elenor Quinones, PharmD, BCPS, BCIDP Clinical Pharmacist 03/24/2019 11:29 AM

## 2019-03-24 NOTE — Anesthesia Preprocedure Evaluation (Addendum)
Anesthesia Evaluation  Patient identified by MRN, date of birth, ID band Patient awake    Reviewed: Allergy & Precautions, NPO status , Patient's Chart, lab work & pertinent test results  Airway Mallampati: I  TM Distance: >3 FB Neck ROM: Full    Dental  (+) Teeth Intact, Dental Advisory Given   Pulmonary asthma , former smoker,    breath sounds clear to auscultation       Cardiovascular negative cardio ROS   Rhythm:Regular Rate:Normal     Neuro/Psych negative neurological ROS  negative psych ROS   GI/Hepatic negative GI ROS, Neg liver ROS,   Endo/Other  diabetes, Type 2, Oral Hypoglycemic Agents  Renal/GU negative Renal ROS     Musculoskeletal negative musculoskeletal ROS (+)   Abdominal (+) + obese,   Peds  Hematology negative hematology ROS (+)   Anesthesia Other Findings   Reproductive/Obstetrics                            Anesthesia Physical Anesthesia Plan  ASA: III  Anesthesia Plan: General   Post-op Pain Management:    Induction: Intravenous  PONV Risk Score and Plan: 4 or greater and Ondansetron, Dexamethasone, Midazolam and Scopolamine patch - Pre-op  Airway Management Planned: Oral ETT  Additional Equipment: None  Intra-op Plan:   Post-operative Plan: Extubation in OR  Informed Consent: I have reviewed the patients History and Physical, chart, labs and discussed the procedure including the risks, benefits and alternatives for the proposed anesthesia with the patient or authorized representative who has indicated his/her understanding and acceptance.     Dental advisory given  Plan Discussed with: CRNA  Anesthesia Plan Comments:        Anesthesia Quick Evaluation

## 2019-03-24 NOTE — Progress Notes (Signed)
Pt is requesting breathing tx. RT paged.

## 2019-03-24 NOTE — Transfer of Care (Signed)
Immediate Anesthesia Transfer of Care Note  Patient: Susan Faulkner  Procedure(s) Performed: LAPAROSCOPIC GASTRIC SLEEVE RESECTION, Upper Endo, ERAS Pathway (N/A Abdomen)  Patient Location: PACU  Anesthesia Type:General  Level of Consciousness: awake, oriented, patient cooperative and responds to stimulation  Airway & Oxygen Therapy: Patient Spontanous Breathing and Patient connected to face mask oxygen  Post-op Assessment: Report given to RN and Post -op Vital signs reviewed and stable  Post vital signs: Reviewed and stable  Last Vitals:  Vitals Value Taken Time  BP    Temp    Pulse 98 03/24/19 1124  Resp 25 03/24/19 1124  SpO2 100 % 03/24/19 1124  Vitals shown include unvalidated device data.  Last Pain:  Vitals:   03/24/19 0753  TempSrc: Oral         Complications: No apparent anesthesia complications

## 2019-03-25 LAB — COMPREHENSIVE METABOLIC PANEL
ALT: 74 U/L — ABNORMAL HIGH (ref 0–44)
AST: 50 U/L — ABNORMAL HIGH (ref 15–41)
Albumin: 3.7 g/dL (ref 3.5–5.0)
Alkaline Phosphatase: 44 U/L (ref 38–126)
Anion gap: 7 (ref 5–15)
BUN: 9 mg/dL (ref 6–20)
CO2: 22 mmol/L (ref 22–32)
Calcium: 8.5 mg/dL — ABNORMAL LOW (ref 8.9–10.3)
Chloride: 109 mmol/L (ref 98–111)
Creatinine, Ser: 0.7 mg/dL (ref 0.44–1.00)
GFR calc Af Amer: >60 mL/min (ref >60)
GFR calc non Af Amer: >60 mL/min (ref >60)
Glucose, Bld: 144 mg/dL — ABNORMAL HIGH (ref 70–99)
Potassium: 4 mmol/L (ref 3.5–5.1)
Sodium: 138 mmol/L (ref 135–145)
Total Bilirubin: 1 mg/dL (ref 0.3–1.2)
Total Protein: 7.3 g/dL (ref 6.5–8.1)

## 2019-03-25 LAB — CBC WITH DIFFERENTIAL/PLATELET
Abs Immature Granulocytes: 0.08 10*3/uL — ABNORMAL HIGH (ref 0.00–0.07)
Basophils Absolute: 0 10*3/uL (ref 0.0–0.1)
Basophils Relative: 0 %
Eosinophils Absolute: 0 10*3/uL (ref 0.0–0.5)
Eosinophils Relative: 0 %
HCT: 37.1 % (ref 36.0–46.0)
Hemoglobin: 11.3 g/dL — ABNORMAL LOW (ref 12.0–15.0)
Immature Granulocytes: 1 %
Lymphocytes Relative: 10 %
Lymphs Abs: 1.1 10*3/uL (ref 0.7–4.0)
MCH: 28.3 pg (ref 26.0–34.0)
MCHC: 30.5 g/dL (ref 30.0–36.0)
MCV: 92.8 fL (ref 80.0–100.0)
Monocytes Absolute: 0.6 10*3/uL (ref 0.1–1.0)
Monocytes Relative: 6 %
Neutro Abs: 9 10*3/uL — ABNORMAL HIGH (ref 1.7–7.7)
Neutrophils Relative %: 83 %
Platelets: 212 10*3/uL (ref 150–400)
RBC: 4 MIL/uL (ref 3.87–5.11)
RDW: 12.6 % (ref 11.5–15.5)
WBC: 10.8 10*3/uL — ABNORMAL HIGH (ref 4.0–10.5)
nRBC: 0 % (ref 0.0–0.2)

## 2019-03-25 LAB — SURGICAL PATHOLOGY

## 2019-03-25 MED ORDER — TRAMADOL HCL 50 MG PO TABS: 50.0000 mg | ORAL_TABLET | Freq: Four times a day (QID) | ORAL | 0 refills | Status: DC | PRN

## 2019-03-25 MED ORDER — ONDANSETRON 4 MG PO TBDP: 4.0000 mg | ORAL_TABLET | Freq: Four times a day (QID) | ORAL | 0 refills | Status: DC | PRN

## 2019-03-25 MED ORDER — ACETAMINOPHEN 500 MG PO TABS: 1000.0000 mg | ORAL_TABLET | Freq: Three times a day (TID) | ORAL | 0 refills | Status: AC

## 2019-03-25 MED ORDER — GABAPENTIN 100 MG PO CAPS: 200.0000 mg | ORAL_CAPSULE | Freq: Two times a day (BID) | ORAL | 0 refills | Status: DC

## 2019-03-25 MED ORDER — PANTOPRAZOLE SODIUM 40 MG PO TBEC: 40.0000 mg | DELAYED_RELEASE_TABLET | Freq: Every day | ORAL | 0 refills | Status: DC

## 2019-03-25 NOTE — Progress Notes (Signed)
Nutrition Brief Note  RD consulted for diet education for patient s/p bariatric surgery. Bariatric nurse coordinator providing education at this time.  If nutrition issues arise, please consult RD.   Amerigo Mcglory, MS, RD, LDN Inpatient Clinical Dietitian Pager: [phone] After Hours Pager: [phone] 

## 2019-03-25 NOTE — Progress Notes (Signed)
Patient alert and oriented, pain is controlled. Patient is tolerating fluids, advanced to protein shake today, patient is tolerating well.  Reviewed Gastric sleeve discharge instructions with patient and patient is able to articulate understanding.  Provided information on BELT program, Support Group and [facility] outpatient pharmacy. All questions answered, will continue to monitor.  Total fluid intake 900 Per dehydration protocol call back one week post op 

## 2019-03-25 NOTE — Progress Notes (Signed)
Patient alert and oriented, Post op day 1.  Provided support and encouragement.  Encouraged pulmonary toilet, ambulation and small sips of liquids.  Completed 12 ounces of bari clear fluid and 2 ounces of protein.  All questions answered.  Will continue to monitor. 

## 2019-03-25 NOTE — Discharge Summary (Signed)
Physician Discharge Summary  Susan Faulkner WJX:[id number] DOB: Aug 22, 1980 DOA: 03/24/2019  PCP: Kristen Loader, FNP  Admit date: 03/24/2019 Discharge date: 03/25/2019  Recommendations for Outpatient Follow-up:   Follow-up Information    Greer Pickerel, MD. Go on 04/17/2019.   Specialty: General Surgery Why: at 945 am Contact information: [address] [place] [address] [phone]        Carlena Hurl, PA-C. Go on 05/14/2019.   Specialty: General Surgery Why: at [place] information: [place] [address] [phone]          Discharge Diagnoses:  Principal Problem:   Severe obesity ([place]) Active Problems:   Fatty liver   OSA on CPAP   History of 2019 novel coronavirus disease (COVID-19)   Low HDL (under 40)   S/P laparoscopic sleeve gastrectomy   Surgical Procedure: Laparoscopic Sleeve Gastrectomy, upper endoscopy  Discharge Condition: Good Disposition: Home  Diet recommendation: Postoperative sleeve gastrectomy diet (liquids only)  Filed Weights   03/24/19 0753 03/24/19 2120  Weight: (!) 142.7 kg (!) 142.7 kg     Hospital Course:  The patient was admitted for a planned laparoscopic sleeve gastrectomy. Please see operative note. Preoperatively the patient was given 5000 units of subcutaneous heparin for DVT prophylaxis. Postoperative prophylactic Lovenox dosing was started on the evening of postoperative day 0. ERAS protocol was used. On the evening of postoperative day 0, the patient was started on water and ice chips. On postoperative day 1 the patient had no fever or tachycardia and was tolerating water in their diet was gradually advanced throughout the day. The patient was ambulating without difficulty. Their vital signs are stable without fever or tachycardia. Their hemoglobin had remained stable. The patient was maintained on their home settings for CPAP therapy. The patient had received discharge instructions  and counseling. They were deemed stable for discharge and had met discharge criteria  BP (!) 178/88 (BP Location: Right Arm)   Pulse 83   Temp 98.4 F (36.9 C) (Oral)   Resp 17   Ht 5' 3" (1.6 m)   Wt (!) 142.7 kg   LMP 03/24/2019   SpO2 100%   BMI 55.73 kg/m   Gen: alert, NAD, non-toxic appearing Pupils: equal, no scleral icterus Pulm: Lungs clear to auscultation, symmetric chest rise CV: regular rate and rhythm Abd: soft, min tender, nondistended.  No cellulitis. No incisional hernia Ext: no edema, no calf tenderness Skin: no rash, no jaundice  Discharge Instructions  Discharge Instructions    Ambulate hourly while awake   Complete by: As directed    Call MD for:  difficulty breathing, headache or visual disturbances   Complete by: As directed    Call MD for:  persistant dizziness or light-headedness   Complete by: As directed    Call MD for:  persistant nausea and vomiting   Complete by: As directed    Call MD for:  redness, tenderness, or signs of infection (pain, swelling, redness, odor or green/yellow discharge around incision site)   Complete by: As directed    Call MD for:  severe uncontrolled pain   Complete by: As directed    Call MD for:  temperature >101 F   Complete by: As directed    Diet bariatric full liquid   Complete by: As directed    Discharge instructions   Complete by: As directed    See bariatric discharge instructions   Incentive spirometry   Complete by:  As directed    Perform hourly while awake     Allergies as of 03/25/2019      Reactions   Shellfish Allergy Anaphylaxis, Swelling   Iodine Other (See Comments)   No known reaction - was told to say she's allergic due to the reaction with shellfish      Medication List    STOP taking these medications   ibuprofen 800 MG tablet Commonly known as: ADVIL   metFORMIN 500 MG 24 hr tablet Commonly known as: Glucophage XR   metroNIDAZOLE 500 MG tablet Commonly known as: FLAGYL    prenatal vitamin w/FE, FA 27-1 MG Tabs tablet     TAKE these medications   acetaminophen 500 MG tablet Commonly known as: TYLENOL Take 2 tablets (1,000 mg total) by mouth every 8 (eight) hours for 5 days.   albuterol 108 (90 Base) MCG/ACT inhaler Commonly known as: VENTOLIN HFA Inhale 2 puffs into the lungs every 4 (four) hours as needed for wheezing or shortness of breath.   gabapentin 100 MG capsule Commonly known as: NEURONTIN Take 2 capsules (200 mg total) by mouth every 12 (twelve) hours.   ondansetron 4 MG disintegrating tablet Commonly known as: ZOFRAN-ODT Take 1 tablet (4 mg total) by mouth every 6 (six) hours as needed for nausea or vomiting.   pantoprazole 40 MG tablet Commonly known as: PROTONIX Take 1 tablet (40 mg total) by mouth daily.   traMADol 50 MG tablet Commonly known as: ULTRAM Take 1 tablet (50 mg total) by mouth every 6 (six) hours as needed (pain).      Follow-up Information    Greer Pickerel, MD. Go on 04/17/2019.   Specialty: General Surgery Why: at 945 am Contact information: [address]  [place] [address] [phone]        Carlena Hurl, PA-C. Go on 05/14/2019.   Specialty: General Surgery Why: at [address] Contact information: [place] [address] [phone]            The results of significant diagnostics from this hospitalization (including imaging, microbiology, ancillary and laboratory) are listed below for reference.    Significant Diagnostic Studies: DG Chest 2 View  Result Date: 03/17/2019 CLINICAL DATA:  Preoperative chest x-ray for bariatric surgery. History of asthma. EXAM: CHEST - 2 VIEW COMPARISON:  01/28/2019. FINDINGS: Mediastinum and hilar structures normal. Low lung volumes. Mild subsegmental atelectasis versus infiltrate right suprahilar region. No pleural effusion or pneumothorax. IMPRESSION: Low lung volumes. Mild subsegmental atelectasis versus infiltrate right suprahilar  region. Electronically Signed   By: Marcello Moores  Register   On: 03/17/2019 17:07    Labs: Basic Metabolic Panel: Recent Labs  Lab 03/25/19 0444  NA 138  K 4.0  CL 109  CO2 22  GLUCOSE 144*  BUN 9  CREATININE 0.70  CALCIUM 8.5*   Liver Function Tests: Recent Labs  Lab 03/25/19 0444  AST 50*  ALT 74*  ALKPHOS 44  BILITOT 1.0  PROT 7.3  ALBUMIN 3.7    CBC: Recent Labs  Lab 03/24/19 1148 03/25/19 0444  WBC  --  10.8*  NEUTROABS  --  9.0*  HGB 12.1 11.3*  HCT 37.9 37.1  MCV  --  92.8  PLT  --  212    CBG: Recent Labs  Lab 03/24/19 1126  GLUCAP 153*    Principal Problem:   Severe obesity ([place]) Active Problems:   Fatty liver   OSA on CPAP   History of 2019  novel coronavirus disease (COVID-19)   Low HDL (under 40)   S/P laparoscopic sleeve gastrectomy   Time coordinating discharge: 15 min  Signed:  Gayland Curry, MD [facility], [place] [phone] 03/25/2019, 12:42 PM

## 2019-03-25 NOTE — Progress Notes (Signed)
D/C instructions given per Parks Neptune, RN, pt was d/cd home.

## 2019-03-30 ENCOUNTER — Telehealth (HOSPITAL_COMMUNITY): Payer: Self-pay

## 2019-04-07 ENCOUNTER — Encounter: Payer: Self-pay | Admitting: Skilled Nursing Facility1

## 2019-04-07 ENCOUNTER — Other Ambulatory Visit: Payer: Self-pay

## 2019-04-07 ENCOUNTER — Encounter: Payer: Self-pay | Attending: General Surgery | Admitting: Skilled Nursing Facility1

## 2019-04-07 DIAGNOSIS — E669 Obesity, unspecified: Secondary | ICD-10-CM | POA: Insufficient documentation

## 2019-04-07 NOTE — Progress Notes (Signed)
2 Week Post-Operative Nutrition Class   Patient was seen on 05/20/18 for Post-Operative Nutrition education at [facility].   Pt arrived late to group session, unable to get weight or body comp reading.   Surgery date: 03/24/2019 Surgery type: Sleeve  Start weight at [facility]: 306.6 lbs Weight today: Pt arrived late   Body Composition Scale Date  Total Body Fat %   Visceral Fat   Fat-Free Mass %    Total Body Water %    Muscle-Mass lbs   Body Fat Displacement          Torso  lbs          Left Leg  lbs          Right Leg  lbs          Left Arm  lbs          Right Arm   lbs      The following the learning objectives were met by the patient during this course:  Identifies Phase 3 (Soft, High Proteins) Dietary Goals and will begin from 2 weeks post-operatively to 2 months post-operatively  Identifies appropriate sources of fluids and proteins   States protein recommendations and appropriate sources post-operatively  Identifies the need for appropriate texture modifications, mastication, and bite sizes when consuming solids  Identifies appropriate multivitamin and calcium sources post-operatively  Describes the need for physical activity post-operatively and will follow MD recommendations  States when to call healthcare provider regarding medication questions or post-operative complications   Handouts given during class include:  Phase 3A: Soft, High Protein Diet Handout   Follow-Up Plan: Patient will follow-up at [facility] in 6 weeks for 2 month post-op nutrition visit for diet advancement per MD.

## 2019-04-13 ENCOUNTER — Telehealth: Payer: Self-pay | Admitting: Skilled Nursing Facility1

## 2019-04-13 NOTE — Telephone Encounter (Signed)
RD called pt to verify fluid intake once starting soft, solid proteins 2 week post-bariatric surgery.   Daily Fluid intake: Daily Protein intake:  Concerns/issues:   Call cannot be completed

## 2019-04-23 ENCOUNTER — Ambulatory Visit: Payer: Self-pay | Admitting: Adult Health

## 2019-04-23 ENCOUNTER — Telehealth: Payer: Self-pay

## 2019-04-23 ENCOUNTER — Encounter: Payer: Self-pay | Admitting: Adult Health

## 2019-04-23 NOTE — Telephone Encounter (Signed)
Patient was a no call/no show for their appointment today.   

## 2019-05-19 ENCOUNTER — Ambulatory Visit: Payer: Self-pay | Admitting: Dietician

## 2019-06-09 ENCOUNTER — Encounter: Payer: Self-pay | Attending: General Surgery | Admitting: Dietician

## 2019-06-09 ENCOUNTER — Other Ambulatory Visit: Payer: Self-pay

## 2019-06-09 ENCOUNTER — Encounter: Payer: Self-pay | Admitting: Dietician

## 2019-06-09 DIAGNOSIS — E669 Obesity, unspecified: Secondary | ICD-10-CM | POA: Insufficient documentation

## 2019-06-09 NOTE — Progress Notes (Signed)
Bariatric Nutrition Follow-Up Visit Medical Nutrition Therapy   3 Months Post-Operative Sleeve Gastrectomy Surgery Surgery Date: 03/24/2019  Pt's Expectations of Surgery/ Goals: to enjoy vacations Pt Reported Successes: more energy    NUTRITION ASSESSMENT  Anthropometrics  Start weight at [facility]: 306.6 lbs (date: 01/22/2019) Today's weight: 263 lbs  Body Composition Scale 06/09/2019  Weight  lbs 263  BMI 46.2  Total Body Fat  % 46.3     Visceral Fat 15  Fat-Free Mass  % 53.6     Total Body Water  % 41.3     Muscle-Mass  lbs 32.2  Body Fat Displacement ---         Torso  lbs 75.5         Left Leg  lbs 15.1         Right Leg  lbs 15.1         Left Arm  lbs 7.5         Right Arm  lbs 7.5    Lifestyle & Dietary Hx Patient reports minimal nausea since advancing past the liquid diet phase, may feel nauseous if she sips fluids with meals. Typical meal pattern is 2 meals plus snacks throughout the afternoon. Does not eat breakfast. May have a burger w/o the bun, chicken caesar wrap with most of the wrap removed, salmon, lean steak, tacos with [employer] protein tortilla chips, or lettuce wraps with meat and cheese for meals. Snacks include [employer] products and sugar-free popsicles. Sausages and hot dogs do not sit well.   24-Hr Dietary Recall First Meal: - Snack: -  Second Meal: lettuce + meat + cheese wrap  Snack: [employer] protein snack   Third Meal: salmon Snack: sugar-free popsicles  Beverages: water, [place] zero sugar juice   Estimated daily fluid intake: 64+ oz Estimated daily protein intake: ~50 g Supplements: bariatric MVI, calcium 3x/day  Current average weekly physical activity: walking almost daily (2-3 miles/day)    Post-Op Goals/ Signs/ Symptoms Using straws: no Drinking while eating: no  Chewing/swallowing difficulties: no Changes in vision: no Changes to mood/headaches: no Hair loss/changes to skin/nails: no Difficulty focusing/concentrating: no Sweating:  no Dizziness/lightheadedness: no Palpitations: no  Carbonated/caffeinated beverages: no N/V/D/C/Gas: constipation (getting better)  Abdominal pain: no Dumping syndrome: yes (after fried fish)    NUTRITION DIAGNOSIS  Overweight/obesity ([place]-3.3) related to past poor dietary habits and physical inactivity as evidenced by completed bariatric surgery and following dietary guidelines for continued weight loss and healthy nutrition status.   NUTRITION INTERVENTION Nutrition counseling (C-1) and education (E-2) to facilitate bariatric surgery goals, including: . Diet advancement to the next phase (phase 4) now including non-starchy vegetables  . The importance of consuming adequate calories as well as certain nutrients daily due to the body's need for essential vitamins, minerals, and fats . The importance of daily physical activity and to reach a goal of at least 150 minutes of moderate to vigorous physical activity weekly (or as directed by their physician) due to benefits such as increased musculature and improved lab values  Handouts Provided Include   Phase 4: Protein + Non-Starchy Vegetables   Learning Style & Readiness for Change Teaching method utilized: Visual & Auditory  Demonstrated degree of understanding via: Teach Back  Barriers to learning/adherence to lifestyle change: None Identified    MONITORING & EVALUATION Dietary intake, weekly physical activity, body weight, and goals in 3 months.  Next Steps Patient is to follow-up in 3 months for 6 month post-op follow-up.

## 2019-06-09 NOTE — Patient Instructions (Signed)

## 2019-09-01 ENCOUNTER — Other Ambulatory Visit: Payer: Self-pay

## 2019-09-01 ENCOUNTER — Encounter: Payer: BC Managed Care – PPO | Attending: General Surgery | Admitting: Skilled Nursing Facility1

## 2019-09-01 DIAGNOSIS — E6609 Other obesity due to excess calories: Secondary | ICD-10-CM

## 2019-09-01 DIAGNOSIS — E669 Obesity, unspecified: Secondary | ICD-10-CM | POA: Diagnosis not present

## 2019-09-03 NOTE — Progress Notes (Signed)
Follow-up visit:  Post-Operative sleeve Surgery  Medical Nutrition Therapy:  Appt start time: 6:00pm end time:  7:00pm  Primary concerns today: Post-operative Bariatric Surgery Nutrition Management 6 Month Post-Op Class  Surgery date: 03/24/2019 Surgery type: Sleeve  Start weight at [facility]: 306.6 lbs Weight today: 242.6   Body Composition Scale 09/01/2019  Weight  lbs 242.6  Total Body Fat  % 43.9     Visceral Fat 13  Fat-Free Mass  % 56     Total Body Water  % 42.5     Muscle-Mass  lbs 32.5  BMI 41.5  Body Fat Displacement ---        Torso  lbs 66.1        Left Leg  lbs 13.2        Right Leg  lbs 13.2        Left Arm  lbs 6.6        Right Arm  lbs 6.6     Information Reviewed/ Discussed During Appointment: -Review of composition scale numbers -Fluid requirements (64-100 ounces) -Protein requirements (60-80g) -Strategies for tolerating diet -Advancement of diet to include Starchy vegetables -Barriers to inclusion of new foods -Inclusion of appropriate multivitamin and calcium supplements  -Exercise recommendations   Fluid intake: adequate   Medications: See List Supplementation: appropriate    Using straws: no Drinking while eating: no Having you been chewing well: yes Chewing/swallowing difficulties: no Changes in vision: no Changes to mood/headaches: no Hair loss/C[place] to skin/Changes to nails: no Any difficulty focusing or concentrating: no Sweating: no Dizziness/Lightheaded: no Palpitations: no  Carbonated beverages: no N/V/D/C/GAS: no Abdominal Pain: no Dumping syndrome: no  Recent physical activity:  ADL's  Progress Towards Goal(s):  In Progress  Handouts given during visit include:  Phase V diet Progression   Goals Sheet  The Benefits of Exercise are endless.....  Support Group Topics  Pt Chosen Goals: Functional Goals: I will chew my food until ____appelsauc consittancy________________ every time I eat by (specific date)  ______09_____  I will not drink or sip a beverage with 3 of my meals 7 days a week by (specific date) _____09___________   Teaching Method Utilized:  Visual Auditory Hands on  Demonstrated degree of understanding via:  Teach Back   Monitoring/Evaluation:  Dietary intake, exercise, and body weight. Follow up in 3 months for 9 month post-op visit.

## 2019-09-16 DIAGNOSIS — E288 Other ovarian dysfunction: Secondary | ICD-10-CM | POA: Diagnosis not present

## 2019-09-16 DIAGNOSIS — Z319 Encounter for procreative management, unspecified: Secondary | ICD-10-CM | POA: Diagnosis not present

## 2019-09-16 DIAGNOSIS — N971 Female infertility of tubal origin: Secondary | ICD-10-CM | POA: Diagnosis not present

## 2019-09-16 DIAGNOSIS — Z31430 Encounter of female for testing for genetic disease carrier status for procreative management: Secondary | ICD-10-CM | POA: Diagnosis not present

## 2019-09-16 DIAGNOSIS — Z8759 Personal history of other complications of pregnancy, childbirth and the puerperium: Secondary | ICD-10-CM | POA: Diagnosis not present

## 2019-10-07 ENCOUNTER — Ambulatory Visit: Payer: BC Managed Care – PPO | Admitting: Obstetrics

## 2019-10-14 ENCOUNTER — Ambulatory Visit: Payer: BC Managed Care – PPO | Admitting: Obstetrics

## 2019-10-15 DIAGNOSIS — Z20822 Contact with and (suspected) exposure to covid-19: Secondary | ICD-10-CM | POA: Diagnosis not present

## 2019-10-15 DIAGNOSIS — Z03818 Encounter for observation for suspected exposure to other biological agents ruled out: Secondary | ICD-10-CM | POA: Diagnosis not present

## 2019-12-02 ENCOUNTER — Ambulatory Visit: Payer: BC Managed Care – PPO | Admitting: Skilled Nursing Facility1

## 2019-12-15 ENCOUNTER — Ambulatory Visit: Payer: BC Managed Care – PPO | Admitting: Skilled Nursing Facility1

## 2020-01-04 ENCOUNTER — Other Ambulatory Visit: Payer: Self-pay

## 2020-01-04 DIAGNOSIS — Z20822 Contact with and (suspected) exposure to covid-19: Secondary | ICD-10-CM

## 2020-01-06 LAB — SARS-COV-2, NAA 2 DAY TAT

## 2020-01-06 LAB — NOVEL CORONAVIRUS, NAA: SARS-CoV-2, NAA: NOT DETECTED

## 2020-01-20 ENCOUNTER — Encounter (HOSPITAL_COMMUNITY): Payer: Self-pay

## 2020-05-29 IMAGING — US US PELVIS COMPLETE TRANSABD/TRANSVAG
1 series · 14 of 25 positions shown · non-contrast
Comparison: None

CLINICAL DATA: Abnormal uterine bleeding

EXAM:
TRANSABDOMINAL AND TRANSVAGINAL ULTRASOUND OF PELVIS
TECHNIQUE: Both transabdominal and transvaginal ultrasound examinations of the
pelvis were performed. Transabdominal technique was performed for
global imaging of the pelvis including uterus, ovaries, adnexal
regions, and pelvic cul-de-sac. It was necessary to proceed with
endovaginal exam following the transabdominal exam to visualize the
endometrium and bilateral ovaries.

[Series 1: us pelvis complete transabd/transvag · 0.23mm/px · 14 of 72 slices shown]
[im 1/72]
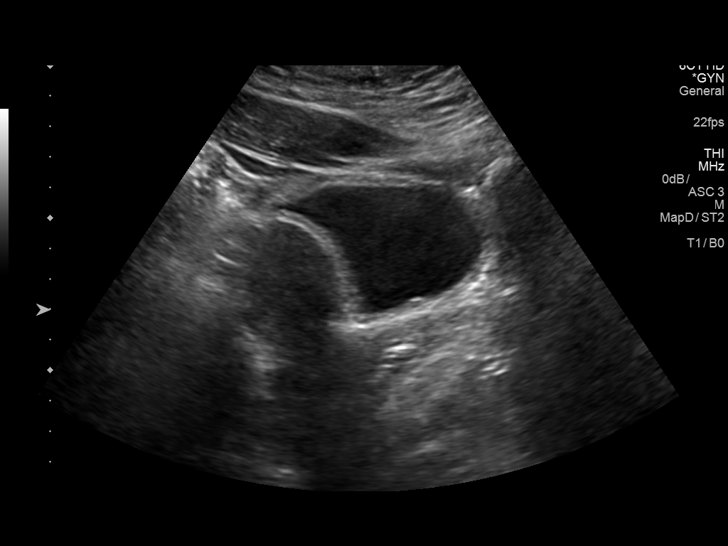
[im 6/72]
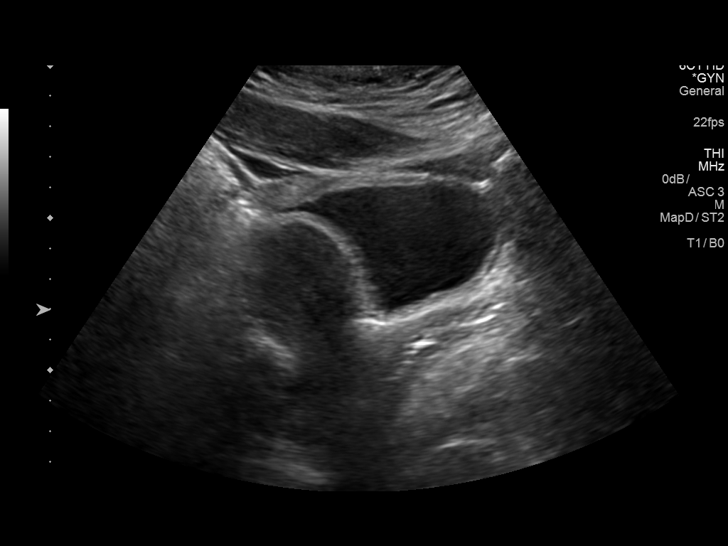
[im 12/72]
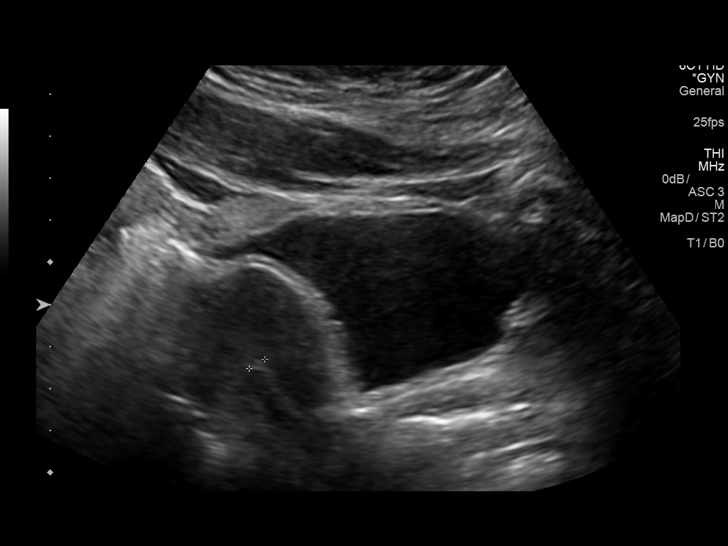
[im 18/72]
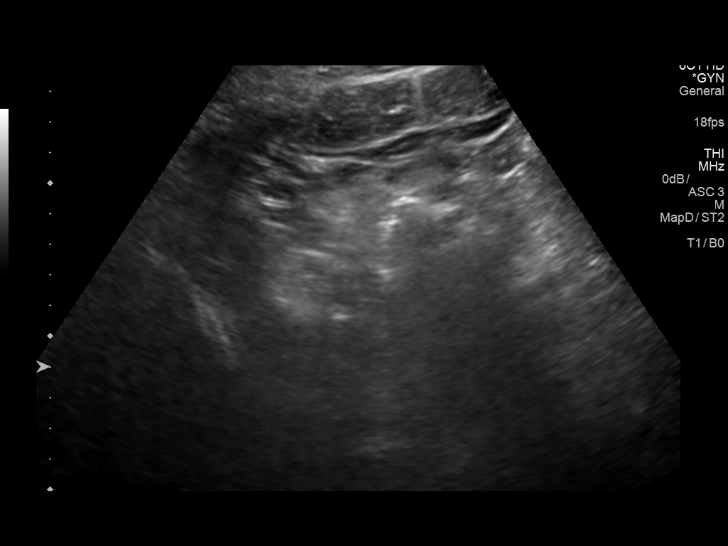
[im 24/72]
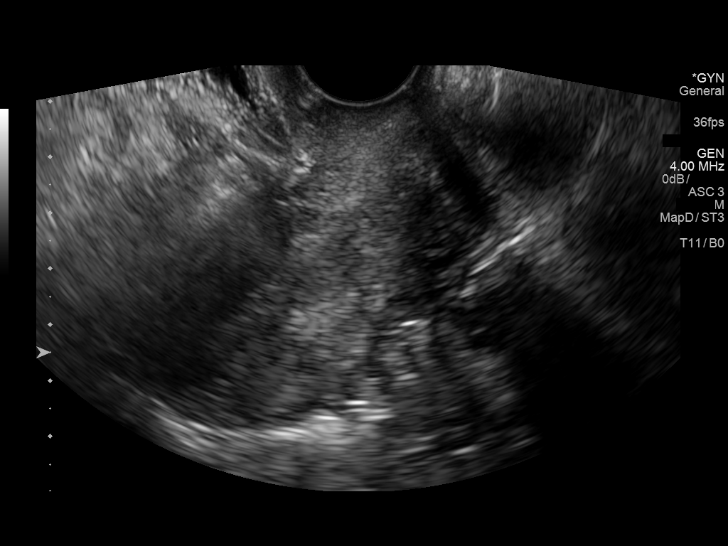
[im 27/72]
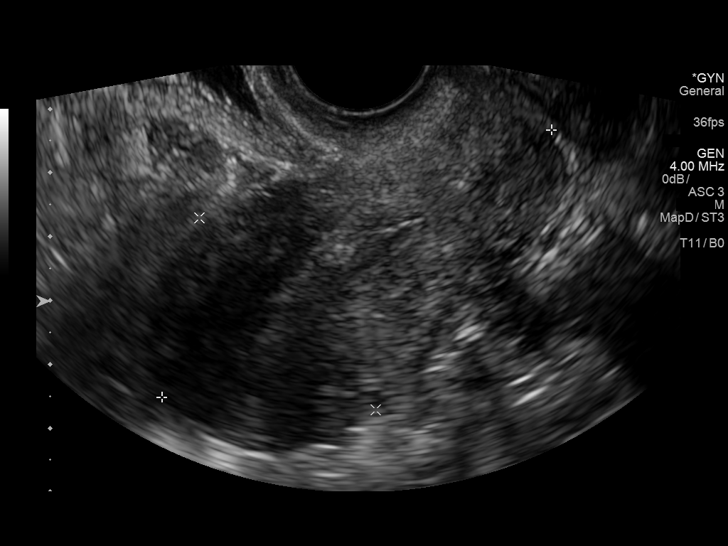
[im 33/72]
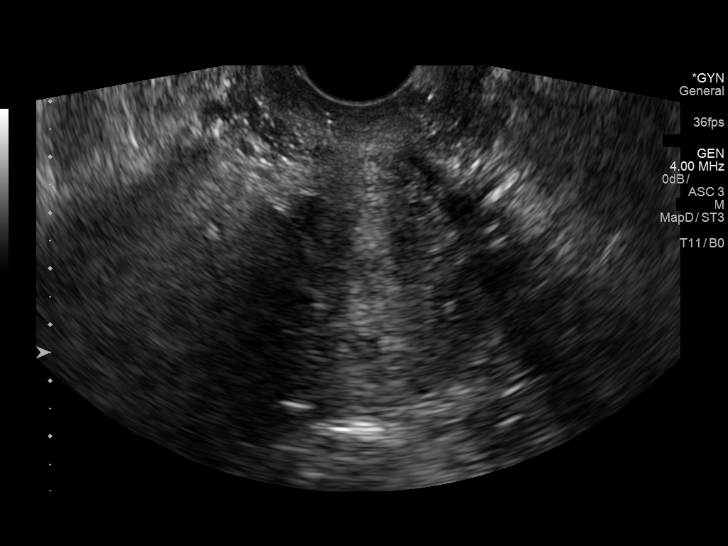
[im 39/72]
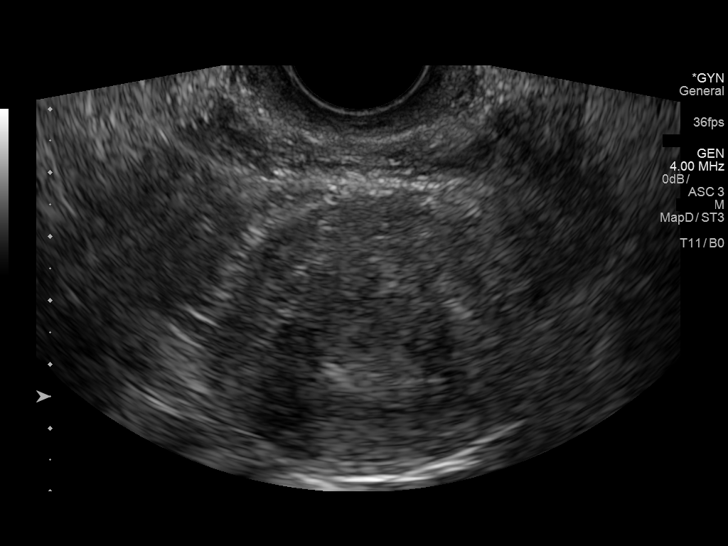
[im 45/72]
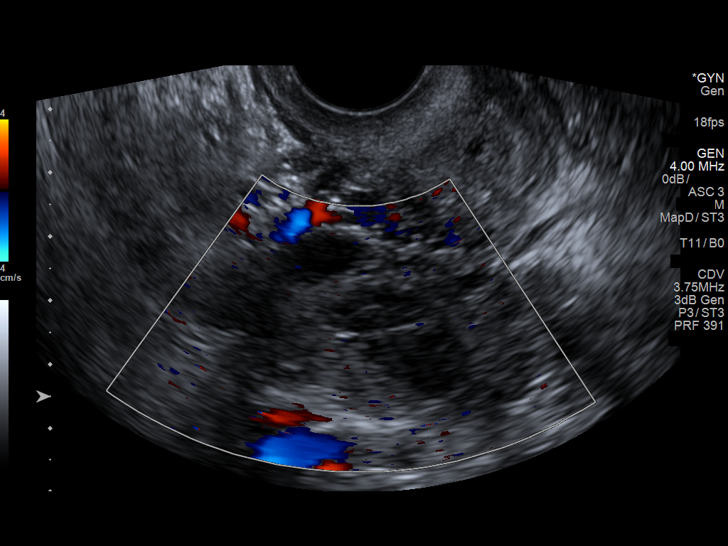
[im 48/72]
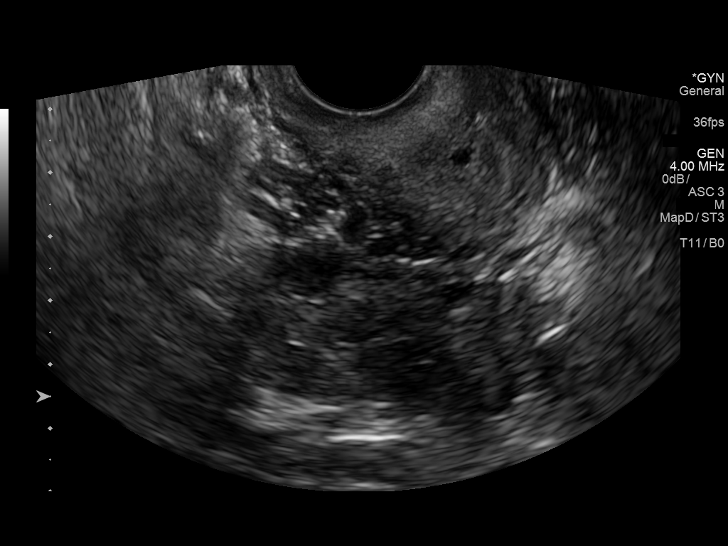
[im 54/72]
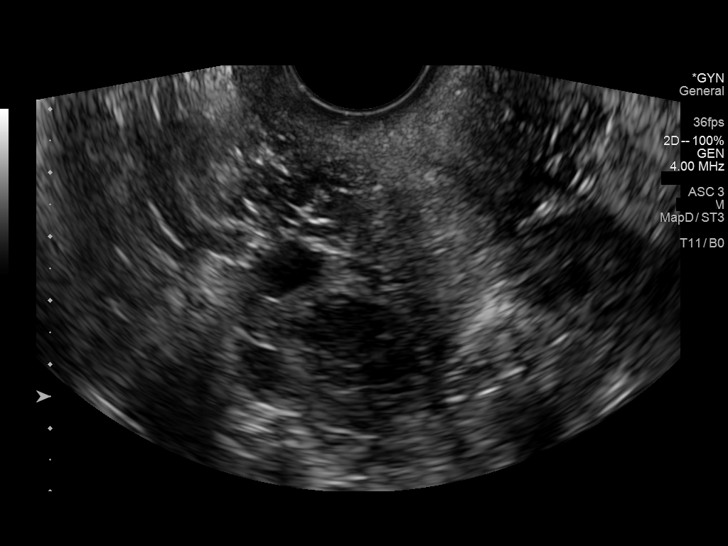
[im 60/72]
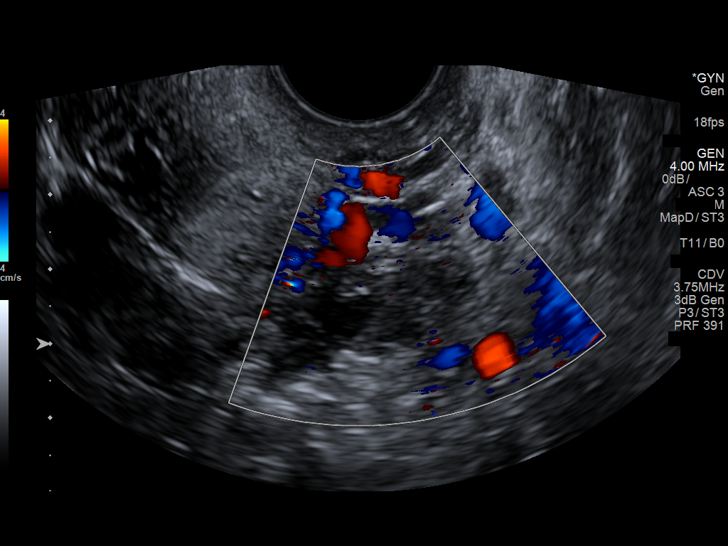
[im 66/72]
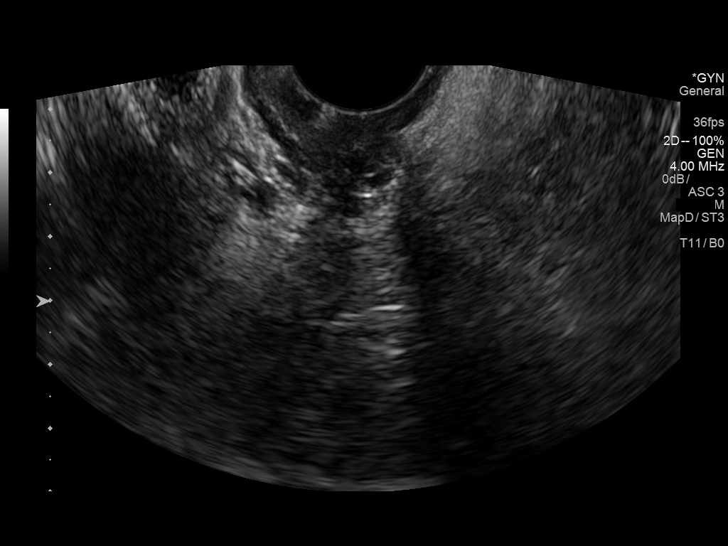
[im 72/72]
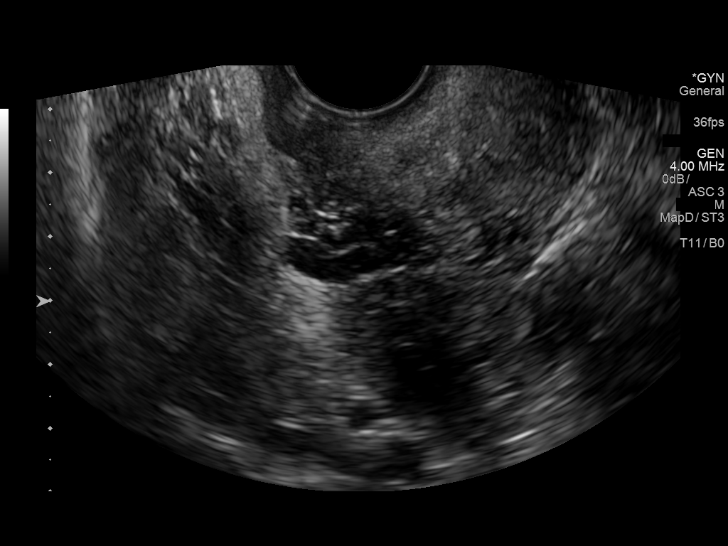

[14 of 25 positions shown; findings below may reference images not displayed]

FINDINGS: Uterus

Measurements: 7.4 x 4.1 x 4.4 cm. No fibroids or other mass
visualized.

Endometrium

Thickness: 5 mm.  No focal abnormality visualized.

Right ovary

Measurements: 4.8 x 2.6 x 3.2 cm. Suspected 2.2 cm right corpus
luteal cyst, physiologic.

Left ovary

Measurements: 4.0 x 1.9 x 2.7 cm. Normal appearance/no adnexal mass.

Other findings

No abnormal free fluid.
IMPRESSION: Negative pelvic ultrasound.

## 2020-06-10 ENCOUNTER — Other Ambulatory Visit: Payer: Self-pay

## 2020-06-10 ENCOUNTER — Emergency Department (HOSPITAL_COMMUNITY)
Admission: EM | Admit: 2020-06-10 | Discharge: 2020-06-11 | Disposition: A | Discharge status: Home / Self Care | Payer: BC Managed Care – PPO | Attending: Emergency Medicine | Admitting: Emergency Medicine

## 2020-06-10 ENCOUNTER — Encounter (HOSPITAL_COMMUNITY): Payer: Self-pay

## 2020-06-10 DIAGNOSIS — J45909 Unspecified asthma, uncomplicated: Secondary | ICD-10-CM | POA: Diagnosis not present

## 2020-06-10 DIAGNOSIS — K625 Hemorrhage of anus and rectum: Secondary | ICD-10-CM | POA: Diagnosis not present

## 2020-06-10 DIAGNOSIS — R61 Generalized hyperhidrosis: Secondary | ICD-10-CM | POA: Insufficient documentation

## 2020-06-10 DIAGNOSIS — R1030 Lower abdominal pain, unspecified: Secondary | ICD-10-CM | POA: Diagnosis not present

## 2020-06-10 DIAGNOSIS — R140 Abdominal distension (gaseous): Secondary | ICD-10-CM | POA: Diagnosis not present

## 2020-06-10 DIAGNOSIS — Z87891 Personal history of nicotine dependence: Secondary | ICD-10-CM | POA: Insufficient documentation

## 2020-06-10 DIAGNOSIS — K5900 Constipation, unspecified: Secondary | ICD-10-CM | POA: Diagnosis not present

## 2020-06-10 DIAGNOSIS — R1110 Vomiting, unspecified: Secondary | ICD-10-CM | POA: Diagnosis not present

## 2020-06-10 LAB — COMPREHENSIVE METABOLIC PANEL
ALT: 25 U/L (ref 0–44)
AST: 20 U/L (ref 15–41)
Albumin: 3.7 g/dL (ref 3.5–5.0)
Alkaline Phosphatase: 45 U/L (ref 38–126)
Anion gap: 7 (ref 5–15)
BUN: 13 mg/dL (ref 6–20)
CO2: 24 mmol/L (ref 22–32)
Calcium: 8.9 mg/dL (ref 8.9–10.3)
Chloride: 106 mmol/L (ref 98–111)
Creatinine, Ser: 0.71 mg/dL (ref 0.44–1.00)
GFR, Estimated: >60 mL/min (ref >60)
Glucose, Bld: 93 mg/dL (ref 70–99)
Potassium: 4.1 mmol/L (ref 3.5–5.1)
Sodium: 137 mmol/L (ref 135–145)
Total Bilirubin: 0.8 mg/dL (ref 0.3–1.2)
Total Protein: 7.1 g/dL (ref 6.5–8.1)

## 2020-06-10 LAB — CBC WITH DIFFERENTIAL/PLATELET
Abs Immature Granulocytes: 0.03 10*3/uL (ref 0.00–0.07)
Basophils Absolute: 0 10*3/uL (ref 0.0–0.1)
Basophils Relative: 0 %
Eosinophils Absolute: 0.1 10*3/uL (ref 0.0–0.5)
Eosinophils Relative: 1 %
HCT: 40.6 % (ref 36.0–46.0)
Hemoglobin: 12.8 g/dL (ref 12.0–15.0)
Immature Granulocytes: 0 %
Lymphocytes Relative: 22 %
Lymphs Abs: 1.8 10*3/uL (ref 0.7–4.0)
MCH: 29.5 pg (ref 26.0–34.0)
MCHC: 31.5 g/dL (ref 30.0–36.0)
MCV: 93.5 fL (ref 80.0–100.0)
Monocytes Absolute: 0.4 10*3/uL (ref 0.1–1.0)
Monocytes Relative: 6 %
Neutro Abs: 5.6 10*3/uL (ref 1.7–7.7)
Neutrophils Relative %: 71 %
Platelets: 266 10*3/uL (ref 150–400)
RBC: 4.34 MIL/uL (ref 3.87–5.11)
RDW: 11.6 % (ref 11.5–15.5)
WBC: 8 10*3/uL (ref 4.0–10.5)
nRBC: 0 % (ref 0.0–0.2)

## 2020-06-10 LAB — LIPASE, BLOOD: Lipase: 36 U/L (ref 11–51)

## 2020-06-10 LAB — URINALYSIS, ROUTINE W REFLEX MICROSCOPIC
Bilirubin Urine: NEGATIVE
Glucose, UA: NEGATIVE mg/dL
Hgb urine dipstick: NEGATIVE
Ketones, ur: 5 mg/dL — AB
Leukocytes,Ua: NEGATIVE
Nitrite: NEGATIVE
Protein, ur: NEGATIVE mg/dL
Specific Gravity, Urine: 1.025 (ref 1.005–1.030)
pH: 5 (ref 5.0–8.0)

## 2020-06-10 LAB — I-STAT BETA HCG BLOOD, ED (MC, WL, AP ONLY): I-stat hCG, quantitative: <5 m[IU]/mL (ref <5)

## 2020-06-10 MED ORDER — FENTANYL CITRATE (PF) 100 MCG/2ML IJ SOLN
50.0000 ug | Freq: Once | INTRAMUSCULAR | Status: AC
  Administered 2020-06-10: 50 ug via INTRAVENOUS
  Filled 2020-06-10: qty 2

## 2020-06-10 MED ORDER — IOHEXOL 9 MG/ML PO SOLN
ORAL | Status: AC
  Administered 2020-06-10: 1000 mL via ORAL
  Filled 2020-06-10: qty 1000

## 2020-06-10 MED ORDER — IOHEXOL 9 MG/ML PO SOLN
500.0000 mL | ORAL | Status: AC
  Administered 2020-06-11: 500 mL via ORAL

## 2020-06-10 MED ORDER — SODIUM CHLORIDE 0.9 % IV BOLUS
500.0000 mL | Freq: Once | INTRAVENOUS | Status: AC
  Administered 2020-06-10: 500 mL via INTRAVENOUS

## 2020-06-10 MED ORDER — ONDANSETRON HCL 4 MG/2ML IJ SOLN
4.0000 mg | Freq: Once | INTRAMUSCULAR | Status: AC
  Administered 2020-06-10: 4 mg via INTRAVENOUS
  Filled 2020-06-10: qty 2

## 2020-06-10 NOTE — ED Triage Notes (Signed)
Pt c/o upper abd pain, n/v and rectal bleeding increasing over the last 1-2 days. States she usually has issues with constipation but today was worse. Hx gastric bypass.

## 2020-06-10 NOTE — ED Provider Notes (Signed)
[facility]-EMERGENCY DEPT Provider Note   CSN: [id number] Arrival date & time: 06/10/20  2056     History Chief Complaint  Patient presents with  . Rectal Bleeding    Susan Faulkner is a 40 y.o. female.  The history is provided by the patient and medical records.  Rectal Bleeding  Susan Faulkner is a 40 y.o. female who presents to the Emergency Department complaining of abdominal pain.  Pain is sharp in nature, located throughout her lower abdomen. Pain started upon waking this morning. She thought that she was constipated and went to the bathroom to have a BM. She was not straining in particular but during that episode had an episode of diaphoresis, vomiting it was near syncopal. Pain radiates now to her low back. She was able to have a small BM this morning. Later today when she was at work she went to the bathroom again and went to pass gas but instead a large amount of gross blood and mucous came out. She has ongoing sharp abdominal pain. No prior similar symptoms. She has a history of bariatric surgery with gastric sleeve in December 2020. LMP was February 22. She denies fevers, dysuria, vaginal discharge.     Past Medical History:  Diagnosis Date  . Asthma   . Ectopic pregnancy 2013   2  . Obesity     Patient Active Problem List   Diagnosis Date Noted  . Obesity 03/24/2019  . Fatty liver 03/24/2019  . OSA on CPAP 03/24/2019  . History of 2019 novel coronavirus disease (COVID-19) 03/24/2019  . Low HDL (under 40) 03/24/2019  . S/P laparoscopic sleeve gastrectomy 03/24/2019    Past Surgical History:  Procedure Laterality Date  . LAPAROSCOPIC GASTRIC SLEEVE RESECTION N/A 03/24/2019   Procedure: LAPAROSCOPIC GASTRIC SLEEVE RESECTION, Upper Endo, ERAS Pathway;  Surgeon: Gaynelle Adu, MD;  Location: [facility];  Service: General;  Laterality: N/A;  . SALPINGECTOMY       OB History    Gravida  2   Para      Term      Preterm      AB  2    Living  0     SAB      IAB      Ectopic  2   Multiple      Live Births              Family History  Problem Relation Age of Onset  . Hypertension Mother   . Diabetes Mother   . Hypertension Father     Social History   Tobacco Use  . Smoking status: Former Smoker    Years: 4.00    Types: Cigarettes  . Smokeless tobacco: Never Used  . Tobacco comment: When pt was age 40, only 6-7 cigarettes per day  Vaping Use  . Vaping Use: Never used  Substance Use Topics  . Alcohol use: Not Currently    Comment: occ  . Drug use: No    Home Medications Prior to Admission medications   Medication Sig Start Date End Date Taking? Authorizing Provider  albuterol (PROVENTIL HFA;VENTOLIN HFA) 108 (90 BASE) MCG/ACT inhaler Inhale 2 puffs into the lungs every 4 (four) hours as needed for wheezing or shortness of breath.    [provider]  gabapentin (NEURONTIN) 100 MG capsule Take 2 capsules (200 mg total) by mouth every 12 (twelve) hours. 03/25/19   Gaynelle Adu, MD  ondansetron (ZOFRAN-ODT) 4 MG disintegrating tablet  Take 1 tablet (4 mg total) by mouth every 6 (six) hours as needed for nausea or vomiting. 03/25/19   Gaynelle Adu, MD  pantoprazole (PROTONIX) 40 MG tablet Take 1 tablet (40 mg total) by mouth daily. 03/25/19   Gaynelle Adu, MD  traMADol (ULTRAM) 50 MG tablet Take 1 tablet (50 mg total) by mouth every 6 (six) hours as needed (pain). 03/25/19   Gaynelle Adu, MD    Allergies    Shellfish allergy and Iodine  Review of Systems   Review of Systems  Gastrointestinal: Positive for hematochezia.  All other systems reviewed and are negative.   Physical Exam Updated Vital Signs BP 133/84 (BP Location: Left Arm)   Pulse 75   Resp 18   Ht 5\' 4"  (1.626 m)   Wt 106.4 kg   LMP 05/13/2020 (Exact Date)   SpO2 100%   BMI 40.27 kg/m   Physical Exam Vitals and nursing note reviewed.  Constitutional:      Appearance: She is well-developed.  HENT:     Head:  Normocephalic and atraumatic.  Cardiovascular:     Rate and Rhythm: Normal rate and regular rhythm.     Heart sounds: No murmur heard.   Pulmonary:     Effort: Pulmonary effort is normal. No respiratory distress.     Breath sounds: Normal breath sounds.  Abdominal:     Palpations: Abdomen is soft.     Tenderness: There is no guarding or rebound.     Comments: Mild lower abdominal tenderness  Genitourinary:    Comments: Mild rectal tenderness, no gross blood.  No mass.   Musculoskeletal:        General: No tenderness.  Skin:    General: Skin is warm and dry.  Neurological:     Mental Status: She is alert and oriented to person, place, and time.  Psychiatric:        Behavior: Behavior normal.     ED Results / Procedures / Treatments   Labs (all labs ordered are listed, but only abnormal results are displayed) Labs Reviewed - No data to display  EKG None  Radiology No results found.  Procedures Procedures   Medications Ordered in ED Medications - No data to display  ED Course  I have reviewed the triage vital signs and the nursing notes.  Pertinent labs & imaging results that were available during my care of the patient were reviewed by me and considered in my medical decision making (see chart for details).    MDM Rules/Calculators/A&P                         patient with history of gastric sleeve here for evaluation of lower abdominal pain rating to the back with bright red blood per rectum at home. She has generalized tenderness on examination without peritoneal findings on evaluation. No gross blood on rectal examination. Plan to obtain labs, CT abdomen pelvis to further evaluate her symptoms. Patient care transferred pending CT and labs. Final Clinical Impression(s) / ED Diagnoses Final diagnoses:  None    Rx / DC Orders ED Discharge Orders    None       05/15/2020, MD 06/10/20 2237

## 2020-06-11 ENCOUNTER — Emergency Department (HOSPITAL_COMMUNITY): Payer: BC Managed Care – PPO

## 2020-06-11 ENCOUNTER — Encounter (HOSPITAL_COMMUNITY): Payer: Self-pay

## 2020-06-11 DIAGNOSIS — R140 Abdominal distension (gaseous): Secondary | ICD-10-CM | POA: Diagnosis not present

## 2020-06-11 MED ORDER — IOHEXOL 300 MG/ML  SOLN
100.0000 mL | Freq: Once | INTRAMUSCULAR | Status: AC | PRN
  Administered 2020-06-11: 100 mL via INTRAVENOUS

## 2020-06-11 NOTE — ED Provider Notes (Signed)
Nursing notes and vitals signs, including pulse oximetry, reviewed.  Summary of this visit's results, reviewed by myself:  EKG:  EKG Interpretation  Date/Time:    Ventricular Rate:    PR Interval:    QRS Duration:   QT Interval:    QTC Calculation:   R Axis:     Text Interpretation:         Labs:  Results for orders placed or performed during the hospital encounter of 06/10/20 (from the past 24 hour(s))  Comprehensive metabolic panel     Status: None   Collection Time: 06/10/20  9:54 PM  Result Value Ref Range   Sodium 137 135 - 145 mmol/L   Potassium 4.1 3.5 - 5.1 mmol/L   Chloride 106 98 - 111 mmol/L   CO2 24 22 - 32 mmol/L   Glucose, Bld 93 70 - 99 mg/dL   BUN 13 6 - 20 mg/dL   Creatinine, Ser 8.50 0.44 - 1.00 mg/dL   Calcium 8.9 8.9 - 27.7 mg/dL   Total Protein 7.1 6.5 - 8.1 g/dL   Albumin 3.7 3.5 - 5.0 g/dL   AST 20 15 - 41 U/L   ALT 25 0 - 44 U/L   Alkaline Phosphatase 45 38 - 126 U/L   Total Bilirubin 0.8 0.3 - 1.2 mg/dL   GFR, Estimated >41 >28 mL/min   Anion gap 7 5 - 15  CBC with Differential     Status: None   Collection Time: 06/10/20  9:54 PM  Result Value Ref Range   WBC 8.0 4.0 - 10.5 K/uL   RBC 4.34 3.87 - 5.11 MIL/uL   Hemoglobin 12.8 12.0 - 15.0 g/dL   HCT 78.6 76.7 - 20.9 %   MCV 93.5 80.0 - 100.0 fL   MCH 29.5 26.0 - 34.0 pg   MCHC 31.5 30.0 - 36.0 g/dL   RDW 47.0 96.2 - 83.6 %   Platelets 266 150 - 400 K/uL   nRBC 0.0 0.0 - 0.2 %   Neutrophils Relative % 71 %   Neutro Abs 5.6 1.7 - 7.7 K/uL   Lymphocytes Relative 22 %   Lymphs Abs 1.8 0.7 - 4.0 K/uL   Monocytes Relative 6 %   Monocytes Absolute 0.4 0.1 - 1.0 K/uL   Eosinophils Relative 1 %   Eosinophils Absolute 0.1 0.0 - 0.5 K/uL   Basophils Relative 0 %   Basophils Absolute 0.0 0.0 - 0.1 K/uL   Immature Granulocytes 0 %   Abs Immature Granulocytes 0.03 0.00 - 0.07 K/uL  Lipase, blood     Status: None   Collection Time: 06/10/20  9:54 PM  Result Value Ref Range   Lipase 36 11 -  51 U/L  I-Stat beta hCG blood, ED     Status: None   Collection Time: 06/10/20 10:25 PM  Result Value Ref Range   I-stat hCG, quantitative <5.0 <5 mIU/mL   Comment 3          Urinalysis, Routine w reflex microscopic     Status: Abnormal   Collection Time: 06/10/20 11:07 PM  Result Value Ref Range   Color, Urine YELLOW YELLOW   APPearance CLEAR CLEAR   Specific Gravity, Urine 1.025 1.005 - 1.030   pH 5.0 5.0 - 8.0   Glucose, UA NEGATIVE NEGATIVE mg/dL   Hgb urine dipstick NEGATIVE NEGATIVE   Bilirubin Urine NEGATIVE NEGATIVE   Ketones, ur 5 (A) NEGATIVE mg/dL   Protein, ur NEGATIVE NEGATIVE mg/dL   Nitrite  NEGATIVE NEGATIVE   Leukocytes,Ua NEGATIVE NEGATIVE    Imaging Studies: CT Abdomen Pelvis W Contrast  Result Date: 06/11/2020 CLINICAL DATA:  Abdominal distension, history of gastric sleeve EXAM: CT ABDOMEN AND PELVIS WITH CONTRAST TECHNIQUE: Multidetector CT imaging of the abdomen and pelvis was performed using the standard protocol following bolus administration of intravenous contrast. CONTRAST:  OMNIPAQUE IOHEXOL 300 MG/ML  SOLN COMPARISON:  Upper GI 01/28/2019, CT 11/07/2016 FINDINGS: Lower chest: Lung bases are clear. Normal heart size. No pericardial effusion. Hepatobiliary: Hypoattenuation along the falciform ligament, likely focal fatty infiltration. No worrisome focal liver lesions. Smooth liver surface contour. Normal hepatic attenuation. Normal gallbladder and biliary tree. Pancreas: No pancreatic ductal dilatation or surrounding inflammatory changes. Spleen: Normal in size. No concerning splenic lesions. Adrenals/Urinary Tract: Normal adrenals. Kidneys are normally located with symmetric enhancement. No suspicious renal lesion, urolithiasis or hydronephrosis. Urinary bladder is unremarkable accounting for underdistention. Stomach/Bowel: Postsurgical changes from a gastric sleeve. No gross complication is evident on this examination. No small bowel thickening or dilatation  is seen. No visible evidence of obstruction. Normal air-filled appendix in the right lower quadrant. No colonic dilatation or wall thickening. Vascular/Lymphatic: No significant vascular findings are present. No enlarged abdominal or pelvic lymph nodes. Reproductive: Normal appearance of the uterus and adnexal structures. Other: No abdominopelvic free fluid or free gas. No bowel containing hernias. Musculoskeletal: No acute osseous abnormality or suspicious osseous lesion. Mild degenerative changes L5-S1, similar to prior. IMPRESSION: 1. No acute intra-abdominal process. Specifically, no evidence of obstruction. 2. Postsurgical changes from gastric sleeve. No gross complication is evident on this examination. Electronically Signed   By: Kreg Shropshire M.D.   On: 06/11/2020 01:14   1:20 AM Patient feeling better.  Advised of reassuring CT and lab findings.  She states she is still constipated and has not gotten relief with magnesium citrate.  She was advised to try MiraLAX.   Sofie Schendel, Jonny Ruiz, MD 06/11/20 0120

## 2020-07-08 DIAGNOSIS — Z20822 Contact with and (suspected) exposure to covid-19: Secondary | ICD-10-CM | POA: Diagnosis not present

## 2020-07-23 ENCOUNTER — Encounter (HOSPITAL_COMMUNITY): Payer: Self-pay

## 2020-07-23 ENCOUNTER — Emergency Department (HOSPITAL_COMMUNITY)
Admission: EM | Admit: 2020-07-23 | Discharge: 2020-07-24 | Disposition: A | Discharge status: Home / Self Care | Payer: BC Managed Care – PPO | Attending: Emergency Medicine | Admitting: Emergency Medicine

## 2020-07-23 ENCOUNTER — Other Ambulatory Visit: Payer: Self-pay

## 2020-07-23 DIAGNOSIS — Z87891 Personal history of nicotine dependence: Secondary | ICD-10-CM | POA: Insufficient documentation

## 2020-07-23 DIAGNOSIS — J45909 Unspecified asthma, uncomplicated: Secondary | ICD-10-CM | POA: Diagnosis not present

## 2020-07-23 DIAGNOSIS — M25551 Pain in right hip: Secondary | ICD-10-CM | POA: Diagnosis not present

## 2020-07-23 DIAGNOSIS — M25561 Pain in right knee: Secondary | ICD-10-CM | POA: Insufficient documentation

## 2020-07-23 DIAGNOSIS — M79604 Pain in right leg: Secondary | ICD-10-CM | POA: Insufficient documentation

## 2020-07-23 MED ORDER — OXYCODONE-ACETAMINOPHEN 5-325 MG PO TABS
2.0000 | ORAL_TABLET | Freq: Once | ORAL | Status: AC
  Administered 2020-07-24: 2 via ORAL
  Filled 2020-07-23: qty 2

## 2020-07-23 MED ORDER — KETOROLAC TROMETHAMINE 60 MG/2ML IM SOLN
60.0000 mg | Freq: Once | INTRAMUSCULAR | Status: AC
  Administered 2020-07-24: 60 mg via INTRAMUSCULAR
  Filled 2020-07-23: qty 2

## 2020-07-23 NOTE — ED Triage Notes (Signed)
Pt came inwith sudden onset R hip pain while at work. Pt states it as a sharp pain that radiates from her upper thigh to her R hip. Pt states it as shooting and sharp

## 2020-07-23 NOTE — ED Provider Notes (Signed)
[facility]-EMERGENCY DEPT Provider Note   CSN: [id number] Arrival date & time: 07/23/20  2227     History Chief Complaint  Patient presents with  . Hip Pain    Susan Faulkner is a 40 y.o. female.  The history is provided by the patient and medical records.  Hip Pain   40 y.o. F here with right hip pain.  States this began abruptly at work after she stood up from her office chair.  She denies any specific injury, trauma, or fall to the hip.  She states pain mostly in her anterior upper thigh and radiating down to mid thigh but not quite to the knee.  Denies focal numbness/weakness.  Patient does report having intercourse this morning in a peculiar positive with her right leg bent so unsure if this contributed.  No bowel or bladder incontinence.  No history of back issues.  No meds PTA.  Past Medical History:  Diagnosis Date  . Asthma   . Ectopic pregnancy 2013   2  . Obesity     Patient Active Problem List   Diagnosis Date Noted  . Obesity 03/24/2019  . Fatty liver 03/24/2019  . OSA on CPAP 03/24/2019  . History of 2019 novel coronavirus disease (COVID-19) 03/24/2019  . Low HDL (under 40) 03/24/2019  . S/P laparoscopic sleeve gastrectomy 03/24/2019    Past Surgical History:  Procedure Laterality Date  . LAPAROSCOPIC GASTRIC SLEEVE RESECTION N/A 03/24/2019   Procedure: LAPAROSCOPIC GASTRIC SLEEVE RESECTION, Upper Endo, ERAS Pathway;  Surgeon: Gaynelle Adu, MD;  Location: [facility] ORS;  Service: General;  Laterality: N/A;  . SALPINGECTOMY       OB History    Gravida  2   Para      Term      Preterm      AB  2   Living  0     SAB      IAB      Ectopic  2   Multiple      Live Births              Family History  Problem Relation Age of Onset  . Hypertension Mother   . Diabetes Mother   . Hypertension Father     Social History   Tobacco Use  . Smoking status: Former Smoker    Years: 4.00    Types: Cigarettes  .  Smokeless tobacco: Never Used  . Tobacco comment: When pt was age 83, only 6-7 cigarettes per day  Vaping Use  . Vaping Use: Never used  Substance Use Topics  . Alcohol use: Not Currently    Comment: occ  . Drug use: No    Home Medications Prior to Admission medications   Medication Sig Start Date End Date Taking? Authorizing Provider  albuterol (PROVENTIL HFA;VENTOLIN HFA) 108 (90 BASE) MCG/ACT inhaler Inhale 2 puffs into the lungs every 4 (four) hours as needed for wheezing or shortness of breath.    [provider]  Biotin 1000 MCG CHEW Chew 2,000 mcg by mouth daily.    [provider]  diphenhydramine-acetaminophen (TYLENOL PM) 25-500 MG TABS tablet Take 1 tablet by mouth at bedtime as needed (pain).    [provider]  ibuprofen (ADVIL) 200 MG tablet Take 800 mg by mouth every 6 (six) hours as needed for moderate pain.    [provider]  Multiple Vitamins-Minerals (BARIATRIC MULTIVITAMINS/IRON PO) Take 1 capsule by mouth daily.    [provider]  gabapentin (NEURONTIN) 100 MG capsule Take 2 capsules (200 mg total) by mouth every 12 (twelve) hours. Patient not taking: No sig reported 03/25/19 06/11/20  Gaynelle Adu, MD  pantoprazole (PROTONIX) 40 MG tablet Take 1 tablet (40 mg total) by mouth daily. Patient not taking: No sig reported 03/25/19 06/11/20  Gaynelle Adu, MD    Allergies    Shellfish allergy and Iodine  Review of Systems   Review of Systems  Musculoskeletal: Positive for arthralgias.  All other systems reviewed and are negative.   Physical Exam Updated Vital Signs BP (!) 162/106 (BP Location: Left Arm)   Pulse 77   Temp 98.3 F (36.8 C) (Oral)   Resp 19   Ht 5\' 4"  (1.626 m)   Wt 103.4 kg   SpO2 98%   BMI 39.14 kg/m   Physical Exam Vitals and nursing note reviewed.  Constitutional:      Appearance: She is well-developed.  HENT:     Head: Normocephalic and atraumatic.  Eyes:     Conjunctiva/sclera:  Conjunctivae normal.     Pupils: Pupils are equal, round, and reactive to light.  Cardiovascular:     Rate and Rhythm: Normal rate and regular rhythm.     Heart sounds: Normal heart sounds.  Pulmonary:     Effort: Pulmonary effort is normal.     Breath sounds: Normal breath sounds.  Abdominal:     General: Bowel sounds are normal.     Palpations: Abdomen is soft.  Musculoskeletal:        General: Normal range of motion.     Cervical back: Normal range of motion.       Legs:     Comments: Pain as depicted, no focal tenderness or overlying skin changes, no rash There is no bony tenderness to the hip, no deformity or  leg shortening  Skin:    General: Skin is warm and dry.  Neurological:     Mental Status: She is alert and oriented to person, place, and time.     ED Results / Procedures / Treatments   Labs (all labs ordered are listed, but only abnormal results are displayed) Labs Reviewed - No data to display  EKG None  Radiology No results found.  Procedures Procedures   Medications Ordered in ED Medications  oxyCODONE-acetaminophen (PERCOCET/ROXICET) 5-325 MG per tablet 2 tablet (has no administration in time range)  ketorolac (TORADOL) injection 60 mg (has no administration in time range)    ED Course  I have reviewed the triage vital signs and the nursing notes.  Pertinent labs & imaging results that were available during my care of the patient were reviewed by me and considered in my medical decision making (see chart for details).    MDM Rules/Calculators/A&P  40 year old female here with right hip pain that began abruptly at work after standing up from chair.  There was no fall or direct trauma.  Does report sexual intercourse this morning in a peculiar position with right leg bent so unsure if this contributed.  She is afebrile and nontoxic.  Pain along anterior right thigh as depicted above, there is no focal tenderness.  No overlying skin changes or  findings of cellulitis.  There is no rash suggestive of shingles.  There is no bony tenderness of the hip or deformity, no leg shortening.  Leg is neurovascularly intact.  She has not had any back pain or bowel or bladder incontinence.  Suspect likely nerve impingement, based on distribution  this is likely L2-L3.  Will attempt pain control.  12:52 AM Patient reassessed-- minimal pain at present. She is sitting upright and appears comfortable.  She feels comfortable with discharge home with continued symptomatic care.  Will need close PCP follow-up.  Work note provided.  Return here for new concerns.  Final Clinical Impression(s) / ED Diagnoses Final diagnoses:  Right leg pain    Rx / DC Orders ED Discharge Orders         Ordered    oxyCODONE-acetaminophen (PERCOCET/ROXICET) 5-325 MG tablet  Every 4 hours PRN        07/24/20 0054           Garlon Hatchet, PA-C 07/24/20 0058    Nira Conn, MD 07/24/20 [phone]

## 2020-07-23 NOTE — ED Notes (Signed)
Pt unable to sign MSE due to broken signature pad

## 2020-07-24 MED ORDER — OXYCODONE-ACETAMINOPHEN 5-325 MG PO TABS: 1.0000 | ORAL_TABLET | ORAL | 0 refills | Status: AC | PRN

## 2020-07-24 NOTE — ED Notes (Signed)
MSE signed and witnessed by RN

## 2020-07-24 NOTE — Discharge Instructions (Signed)
Take the prescribed medication as directed.  Do not drive while taking this.  Do not mix with alcohol. Follow-up with your primary care doctor if ongoing issues. Return to the ED for new or worsening symptoms.

## 2020-08-11 DIAGNOSIS — Z630 Problems in relationship with spouse or partner: Secondary | ICD-10-CM | POA: Diagnosis not present

## 2020-08-11 DIAGNOSIS — F4320 Adjustment disorder, unspecified: Secondary | ICD-10-CM | POA: Diagnosis not present

## 2020-08-17 DIAGNOSIS — F4320 Adjustment disorder, unspecified: Secondary | ICD-10-CM | POA: Diagnosis not present

## 2020-08-17 DIAGNOSIS — Z630 Problems in relationship with spouse or partner: Secondary | ICD-10-CM | POA: Diagnosis not present

## 2020-08-29 DIAGNOSIS — E65 Localized adiposity: Secondary | ICD-10-CM | POA: Diagnosis not present

## 2020-09-05 DIAGNOSIS — E669 Obesity, unspecified: Secondary | ICD-10-CM | POA: Diagnosis not present

## 2020-10-11 ENCOUNTER — Encounter (HOSPITAL_COMMUNITY): Payer: Self-pay | Admitting: *Deleted

## 2020-11-09 DIAGNOSIS — N898 Other specified noninflammatory disorders of vagina: Secondary | ICD-10-CM | POA: Diagnosis not present

## 2020-11-09 DIAGNOSIS — Z113 Encounter for screening for infections with a predominantly sexual mode of transmission: Secondary | ICD-10-CM | POA: Diagnosis not present

## 2021-07-21 ENCOUNTER — Emergency Department (HOSPITAL_COMMUNITY): Payer: Self-pay

## 2021-07-21 ENCOUNTER — Other Ambulatory Visit: Payer: Self-pay

## 2021-07-21 ENCOUNTER — Emergency Department (HOSPITAL_COMMUNITY)
Admission: EM | Admit: 2021-07-21 | Discharge: 2021-07-21 | Disposition: A | Discharge status: Home / Self Care | Payer: Self-pay | Attending: Emergency Medicine | Admitting: Emergency Medicine

## 2021-07-21 ENCOUNTER — Encounter (HOSPITAL_COMMUNITY): Payer: Self-pay

## 2021-07-21 DIAGNOSIS — J45909 Unspecified asthma, uncomplicated: Secondary | ICD-10-CM | POA: Insufficient documentation

## 2021-07-21 DIAGNOSIS — R197 Diarrhea, unspecified: Secondary | ICD-10-CM | POA: Insufficient documentation

## 2021-07-21 DIAGNOSIS — R1030 Lower abdominal pain, unspecified: Secondary | ICD-10-CM | POA: Insufficient documentation

## 2021-07-21 DIAGNOSIS — Z7951 Long term (current) use of inhaled steroids: Secondary | ICD-10-CM | POA: Insufficient documentation

## 2021-07-21 DIAGNOSIS — R112 Nausea with vomiting, unspecified: Secondary | ICD-10-CM | POA: Insufficient documentation

## 2021-07-21 LAB — URINALYSIS, ROUTINE W REFLEX MICROSCOPIC
Bilirubin Urine: NEGATIVE
Glucose, UA: NEGATIVE mg/dL
Ketones, ur: NEGATIVE mg/dL
Leukocytes,Ua: NEGATIVE
Nitrite: NEGATIVE
Protein, ur: NEGATIVE mg/dL
Specific Gravity, Urine: >1.046 — ABNORMAL HIGH (ref 1.005–1.030)
pH: 5 (ref 5.0–8.0)

## 2021-07-21 LAB — COMPREHENSIVE METABOLIC PANEL
ALT: 16 U/L (ref 0–44)
AST: 16 U/L (ref 15–41)
Albumin: 3.7 g/dL (ref 3.5–5.0)
Alkaline Phosphatase: 41 U/L (ref 38–126)
Anion gap: 4 — ABNORMAL LOW (ref 5–15)
BUN: 20 mg/dL (ref 6–20)
CO2: 23 mmol/L (ref 22–32)
Calcium: 8.7 mg/dL — ABNORMAL LOW (ref 8.9–10.3)
Chloride: 112 mmol/L — ABNORMAL HIGH (ref 98–111)
Creatinine, Ser: 0.85 mg/dL (ref 0.44–1.00)
GFR, Estimated: >60 mL/min (ref >60)
Glucose, Bld: 99 mg/dL (ref 70–99)
Potassium: 3.8 mmol/L (ref 3.5–5.1)
Sodium: 139 mmol/L (ref 135–145)
Total Bilirubin: 0.6 mg/dL (ref 0.3–1.2)
Total Protein: 7.1 g/dL (ref 6.5–8.1)

## 2021-07-21 LAB — CBC
HCT: 36.7 % (ref 36.0–46.0)
Hemoglobin: 12.1 g/dL (ref 12.0–15.0)
MCH: 30.6 pg (ref 26.0–34.0)
MCHC: 33 g/dL (ref 30.0–36.0)
MCV: 92.7 fL (ref 80.0–100.0)
Platelets: 250 10*3/uL (ref 150–400)
RBC: 3.96 MIL/uL (ref 3.87–5.11)
RDW: 11.8 % (ref 11.5–15.5)
WBC: 7.9 10*3/uL (ref 4.0–10.5)
nRBC: 0 % (ref 0.0–0.2)

## 2021-07-21 LAB — LIPASE, BLOOD: Lipase: 38 U/L (ref 11–51)

## 2021-07-21 LAB — I-STAT BETA HCG BLOOD, ED (MC, WL, AP ONLY): I-stat hCG, quantitative: <5 m[IU]/mL (ref <5)

## 2021-07-21 MED ORDER — SODIUM CHLORIDE 0.9 % IV BOLUS
1000.0000 mL | Freq: Once | INTRAVENOUS | Status: AC
  Administered 2021-07-21: 1000 mL via INTRAVENOUS

## 2021-07-21 MED ORDER — SODIUM CHLORIDE 0.9 % IV SOLN: INTRAVENOUS | Status: DC

## 2021-07-21 MED ORDER — HYDROCODONE-ACETAMINOPHEN 5-325 MG PO TABS: 1.0000 | ORAL_TABLET | Freq: Four times a day (QID) | ORAL | 0 refills | Status: AC | PRN

## 2021-07-21 MED ORDER — ONDANSETRON 4 MG PO TBDP: 4.0000 mg | ORAL_TABLET | Freq: Three times a day (TID) | ORAL | 1 refills | Status: AC | PRN

## 2021-07-21 MED ORDER — HYDROMORPHONE HCL 1 MG/ML IJ SOLN
1.0000 mg | Freq: Once | INTRAMUSCULAR | Status: AC
  Administered 2021-07-21: 1 mg via INTRAVENOUS
  Filled 2021-07-21: qty 1

## 2021-07-21 MED ORDER — ONDANSETRON HCL 4 MG/2ML IJ SOLN
4.0000 mg | Freq: Once | INTRAMUSCULAR | Status: AC
  Administered 2021-07-21: 4 mg via INTRAVENOUS
  Filled 2021-07-21: qty 2

## 2021-07-21 MED ORDER — IOHEXOL 300 MG/ML  SOLN
100.0000 mL | Freq: Once | INTRAMUSCULAR | Status: AC | PRN
  Administered 2021-07-21: 100 mL via INTRAVENOUS

## 2021-07-21 NOTE — ED Notes (Signed)
Patient transported to CT 

## 2021-07-21 NOTE — ED Triage Notes (Signed)
Patient c/o lower abdominal cramping that started around 0200 today. Patient also c/o N/v/D ?

## 2021-07-21 NOTE — ED Provider Notes (Addendum)
[facility]-EMERGENCY DEPT Provider Note   CSN: [id number] Arrival date & time: 07/21/21  1717     History  Chief Complaint  Patient presents with   Abdominal Pain    Susan Faulkner is a 41 y.o. female.  Patient with development abdominal cramping more thorough in the lower quadrants quite severe at 2 in the morning lasted for 3 hours and resolved.  Patient able to eat food 11 cups morning without any problems.  In this evening cramping came back kind of around the umbilical area and a little bit of the lower quadrants it was severe and associated with several episodes of vomiting and diarrhea.  No blood in either 1.  Patient cyst on the tail end of her menstrual period no heavy bleeding.  So did not understand why she was having cramping although this may be abdominal cramping and menstrual cramping.  Past medical history significant for asthma ectopic pregnancy and obesity.      Home Medications Prior to Admission medications   Medication Sig Start Date End Date Taking? Authorizing Provider  albuterol (PROVENTIL HFA;VENTOLIN HFA) 108 (90 BASE) MCG/ACT inhaler Inhale 2 puffs into the lungs every 4 (four) hours as needed for wheezing or shortness of breath.    [provider]  Biotin 1000 MCG CHEW Chew 2,000 mcg by mouth daily.    [provider]  diphenhydramine-acetaminophen (TYLENOL PM) 25-500 MG TABS tablet Take 1 tablet by mouth at bedtime as needed (pain).    [provider]  ibuprofen (ADVIL) 200 MG tablet Take 800 mg by mouth every 6 (six) hours as needed for moderate pain.    [provider]  Multiple Vitamins-Minerals (BARIATRIC MULTIVITAMINS/IRON PO) Take 1 capsule by mouth daily.    [provider]  oxyCODONE-acetaminophen (PERCOCET/ROXICET) 5-325 MG tablet Take 1 tablet by mouth every 4 (four) hours as needed. 07/24/20   Garlon Hatchet, PA-C  gabapentin (NEURONTIN) 100 MG capsule Take 2 capsules (200 mg  total) by mouth every 12 (twelve) hours. Patient not taking: No sig reported 03/25/19 06/11/20  Gaynelle Adu, MD  pantoprazole (PROTONIX) 40 MG tablet Take 1 tablet (40 mg total) by mouth daily. Patient not taking: No sig reported 03/25/19 06/11/20  Gaynelle Adu, MD      Allergies    Shellfish allergy and Iodine    Review of Systems   Review of Systems  Constitutional:  Negative for chills and fever.  HENT:  Negative for ear pain and sore throat.   Eyes:  Negative for pain and visual disturbance.  Respiratory:  Negative for cough and shortness of breath.   Cardiovascular:  Negative for chest pain and palpitations.  Gastrointestinal:  Positive for abdominal pain, diarrhea, nausea and vomiting.  Genitourinary:  Positive for vaginal bleeding. Negative for dysuria and hematuria.  Musculoskeletal:  Negative for arthralgias and back pain.  Skin:  Negative for color change and rash.  Neurological:  Negative for seizures and syncope.  All other systems reviewed and are negative.  Physical Exam Updated Vital Signs BP (!) 144/88 (BP Location: Right Arm)   Pulse 63   Temp 98.4 F (36.9 C) (Oral)   Resp 18   Ht 1.626 m (5\' 4" )   Wt 108.4 kg   LMP 07/21/2021   SpO2 99%   BMI 41.02 kg/m  Physical Exam Vitals and nursing note reviewed.  Constitutional:      General: She is not in acute distress.    Appearance: She is well-developed.  She is obese.  HENT:     Head: Normocephalic and atraumatic.  Eyes:     Conjunctiva/sclera: Conjunctivae normal.  Cardiovascular:     Rate and Rhythm: Normal rate and regular rhythm.     Heart sounds: No murmur heard. Pulmonary:     Effort: Pulmonary effort is normal. No respiratory distress.     Breath sounds: Normal breath sounds.  Abdominal:     General: Bowel sounds are normal. There is no distension.     Palpations: Abdomen is soft.     Tenderness: There is no abdominal tenderness. There is no guarding.     Hernia: No hernia is present.   Musculoskeletal:        General: No swelling.     Cervical back: Neck supple.  Skin:    General: Skin is warm and dry.     Capillary Refill: Capillary refill takes less than 2 seconds.  Neurological:     General: No focal deficit present.     Mental Status: She is alert.  Psychiatric:        Mood and Affect: Mood normal.    ED Results / Procedures / Treatments   Labs (all labs ordered are listed, but only abnormal results are displayed) Labs Reviewed  COMPREHENSIVE METABOLIC PANEL - Abnormal; Notable for the following components:      Result Value   Chloride 112 (*)    Calcium 8.7 (*)    Anion gap 4 (*)    All other components within normal limits  LIPASE, BLOOD  CBC  URINALYSIS, ROUTINE W REFLEX MICROSCOPIC  I-STAT BETA HCG BLOOD, ED ([facility], [facility], [facility] ONLY)    EKG None  Radiology CT Abdomen Pelvis W Contrast  Result Date: 07/21/2021 CLINICAL DATA:  Nausea/vomiting Abdominal pain, acute, nonlocalized EXAM: CT ABDOMEN AND PELVIS WITH CONTRAST TECHNIQUE: Multidetector CT imaging of the abdomen and pelvis was performed using the standard protocol following bolus administration of intravenous contrast. RADIATION DOSE REDUCTION: This exam was performed according to the departmental dose-optimization program which includes automated exposure control, adjustment of the mA and/or kV according to patient size and/or use of iterative reconstruction technique. CONTRAST:  OMNIPAQUE IOHEXOL 300 MG/ML  SOLN COMPARISON:  06/11/2020 FINDINGS: Lower chest: Included lung bases are clear.  Heart size is normal. Hepatobiliary: No focal liver abnormality is seen. No gallstones, gallbladder wall thickening, or biliary dilatation. Pancreas: Unremarkable. No pancreatic ductal dilatation or surrounding inflammatory changes. Spleen: Normal in size without focal abnormality. Adrenals/Urinary Tract: Unremarkable adrenal glands. Kidneys enhance symmetrically without focal lesion, stone, or hydronephrosis.  Ureters are nondilated. Urinary bladder appears unremarkable. Stomach/Bowel: Postsurgical changes from prior sleeve gastrectomy. Appendix appears normal. No evidence of bowel wall thickening, distention, or inflammatory changes. Vascular/Lymphatic: No significant vascular findings are present. No enlarged abdominal or pelvic lymph nodes. Reproductive: Uterus and bilateral adnexa are unremarkable. Other: No free fluid. No abdominopelvic fluid collection. No pneumoperitoneum. No abdominal wall hernia. Musculoskeletal: No acute or significant osseous findings. IMPRESSION: No acute abdominopelvic findings. Electronically Signed   By: Duanne Guess D.O.   On: 07/21/2021 20:01    Procedures Procedures    Medications Ordered in ED Medications  0.9 %  sodium chloride infusion ( Intravenous New Bag/Given 07/21/21 1852)  sodium chloride 0.9 % bolus 1,000 mL (1,000 mLs Intravenous New Bag/Given 07/21/21 1852)  ondansetron (ZOFRAN) injection 4 mg (4 mg Intravenous Given 07/21/21 1852)  HYDROmorphone (DILAUDID) injection 1 mg (1 mg Intravenous Given 07/21/21 1853)  iohexol (OMNIPAQUE) 300 MG/ML solution  100 mL (100 mLs Intravenous Contrast Given 07/21/21 1926)    ED Course/ Medical Decision Making/ A&P                           Medical Decision Making Amount and/or Complexity of Data Reviewed Labs: ordered. Radiology: ordered.  Risk Prescription drug management.   Patient's abdominal exam nontender no acute distress.  Patient does states she still having cramping and having nausea.  Will give Zofran will give hydromorphone 1 L of fluid.  And then get CT scan abdomen and pelvis.  Patient's pregnancy test was negative lipase complete metabolic panel pending CBC no leukocytosis hemoglobin 12.1 platelets 250.  CT scan of the abdomen and without any acute findings at all.  Patient feels much better after the Zofran and hydromorphone.  Urinalysis is pending.  If urinalysis is negative.  Will treat  symptomatically.  Not concerned about PID.  Patient's abdominal exam nontender.  Pain was mostly crampy really kind of periumbilical some in the lower quadrants that had the episodes of vomiting and diarrhea highly suggestive of gastroenteritis.  Patient's urinalysis has some RBCs.  But she still having some mild vaginal bleeding due to her menses.  Patient felt much better after the pain medicine.  States some of it is coming back we will redose with Zofran and hydromorphone patient's urine sent for urine culture but most likely this is contamination from the vaginal bleeding.  Patient nontoxic no acute distress no acute abdominal process.  Not concerned about PID based on exam.  We will treat patient with antinausea medicine and pain medicine.  Likely stated patient had diarrhea and nausea and vomiting.  CT showed no acute process.   Final Clinical Impression(s) / ED Diagnoses Final diagnoses:  Lower abdominal pain  Nausea vomiting and diarrhea    Rx / DC Orders ED Discharge Orders     None         Vanetta Mulders, MD 07/21/21 5621    Vanetta Mulders, MD 07/21/21 2125    Vanetta Mulders, MD 07/21/21 2234

## 2021-07-21 NOTE — Discharge Instructions (Signed)
As we discussed CT scan of the abdomen without acute findings.  Labs were normal.  Most likely this is a viral stomach bug.  Take the antinausea medicine as directed.  Take the hydrocodone as directed.  Return for any new or worse symptoms or if not improved in a couple days.  Or follow-up with your primary care doctor. ?

## 2021-07-23 LAB — URINE CULTURE

## 2022-10-18 ENCOUNTER — Encounter (HOSPITAL_COMMUNITY): Payer: Self-pay | Admitting: *Deleted

## 2023-08-21 IMAGING — CT CT ABD-PELV W/ CM
2 of 4 series · 17 of 46 positions shown, 19 images · IV contrast (agent unspecified)
Comparison: 06/11/2020

CLINICAL DATA: Nausea/vomiting Abdominal pain, acute, nonlocalized

EXAM:
CT ABDOMEN AND PELVIS WITH CONTRAST
TECHNIQUE: Multidetector CT imaging of the abdomen and pelvis was performed
using the standard protocol following bolus administration of
intravenous contrast.

[Series 2: axial st · axial · 0.91mm/px · z∈[+1144,+1534]mm · 14 of 90 slices shown, 16 images]
[im 6/90  soft-tissue]
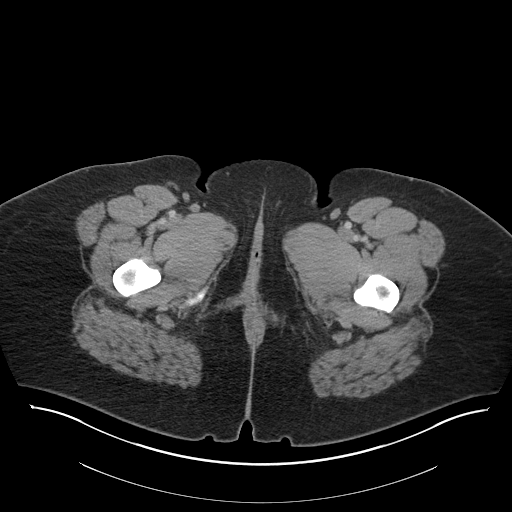
[im 6/90  bone]
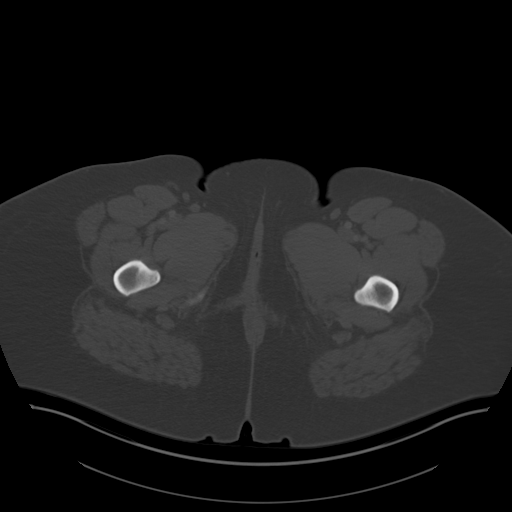
[im 11/90  soft-tissue]
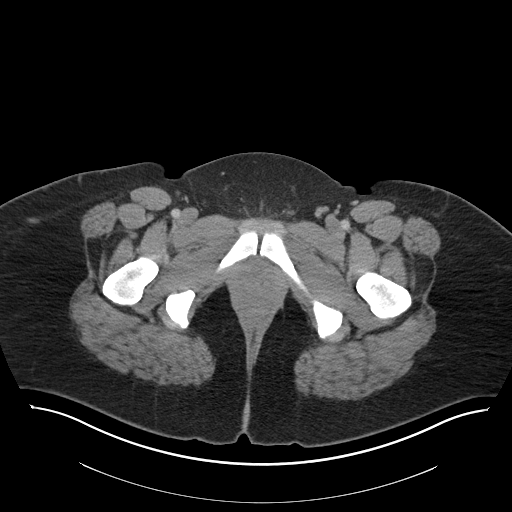
[im 16/90  soft-tissue]
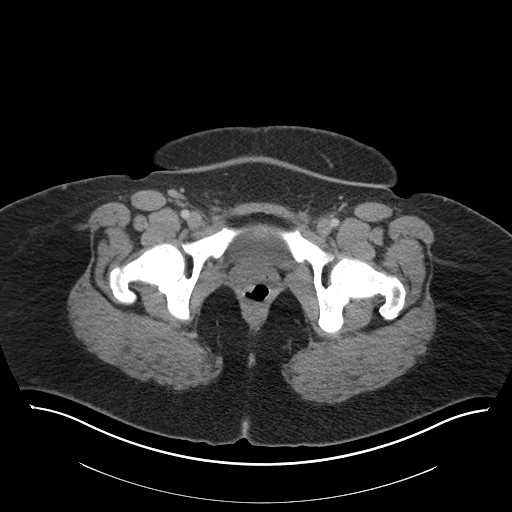
[im 27/90  soft-tissue]
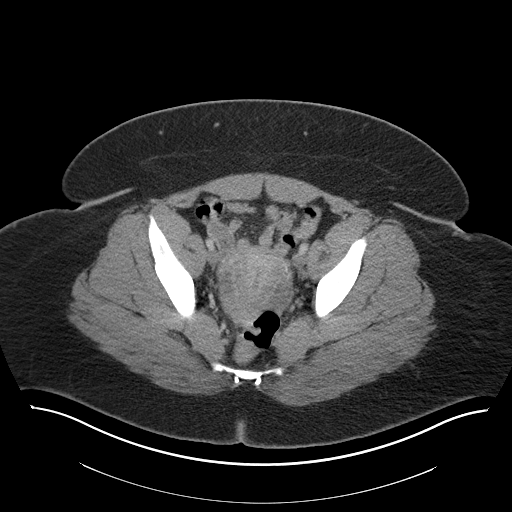
[im 32/90  soft-tissue]
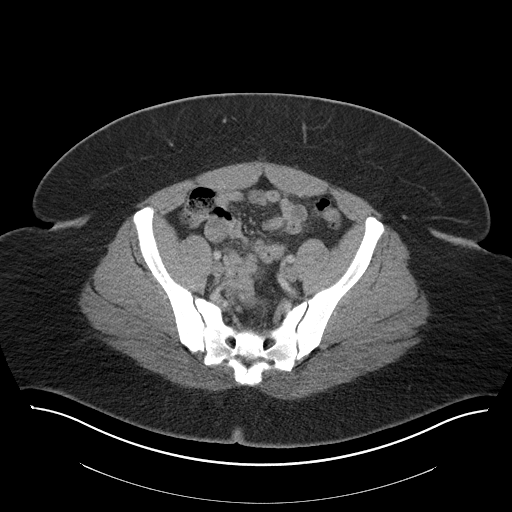
[im 37/90  soft-tissue]
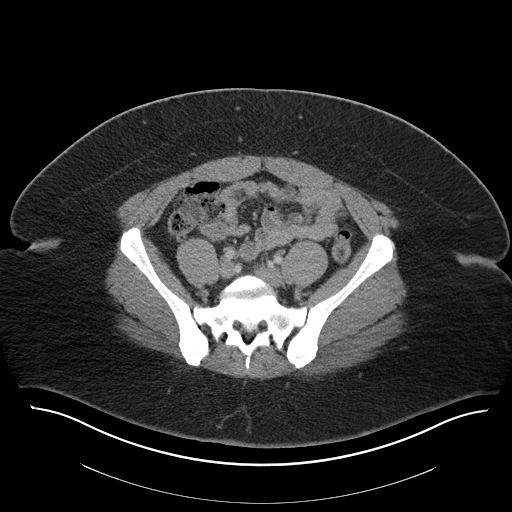
[im 42/90  soft-tissue]
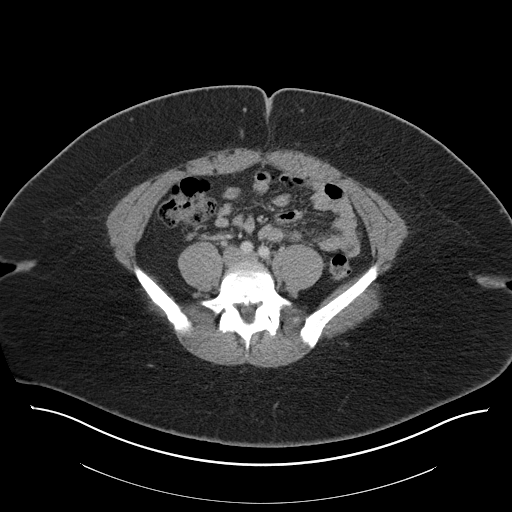
[im 48/90  soft-tissue]
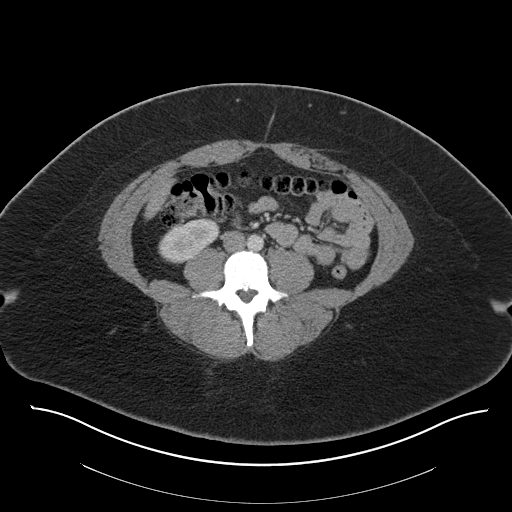
[im 53/90  soft-tissue]
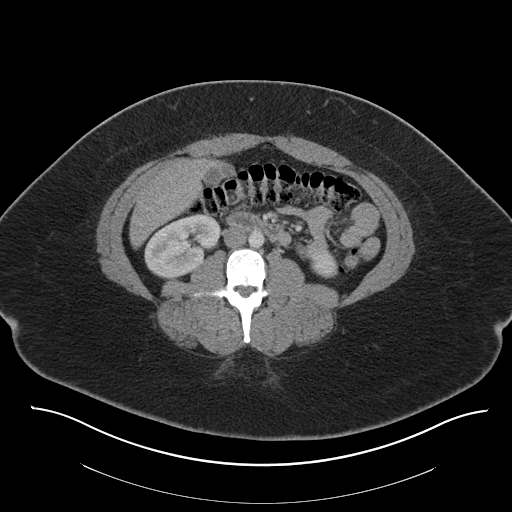
[im 53/90  bone]
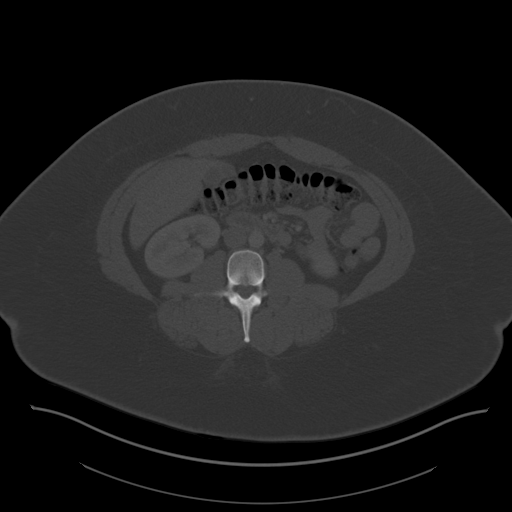
[im 58/90  soft-tissue]
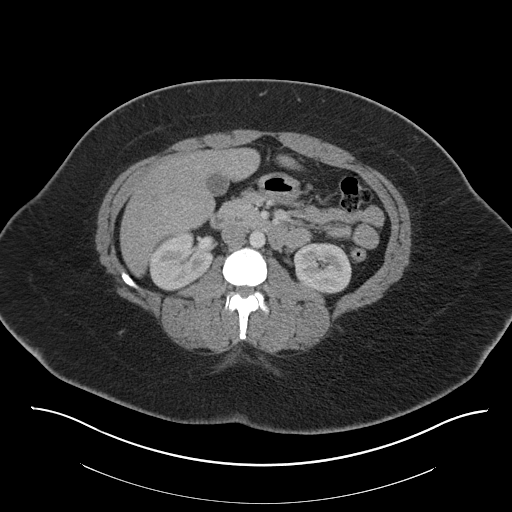
[im 69/90  soft-tissue]
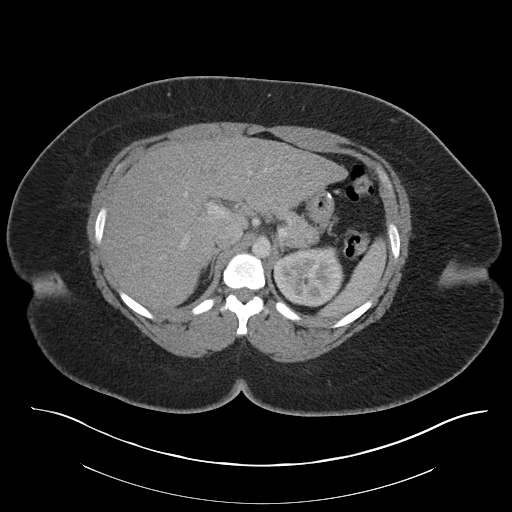
[im 74/90  soft-tissue]
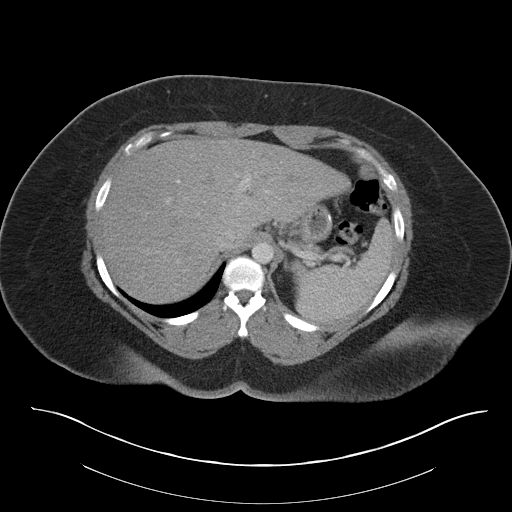
[im 79/90  soft-tissue]
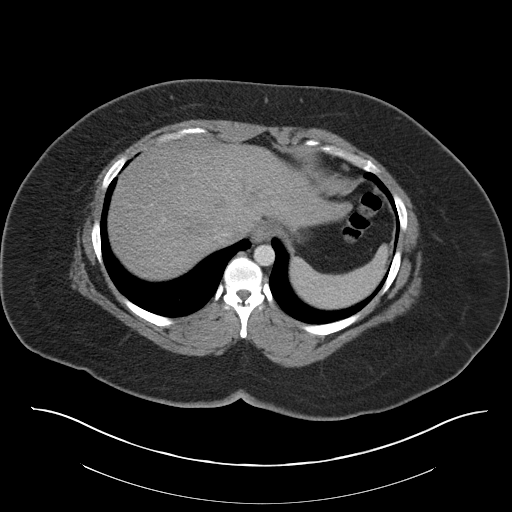
[im 84/90  soft-tissue]
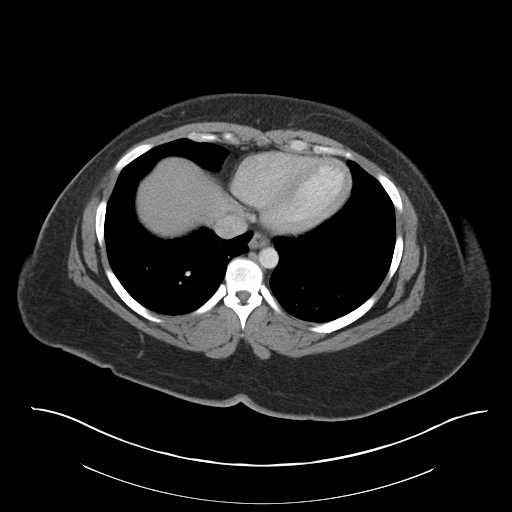

[Series 5: coronal st · coronal · 0.90mm/px · 3 of 160 slices shown]
[im 54/160  soft-tissue]
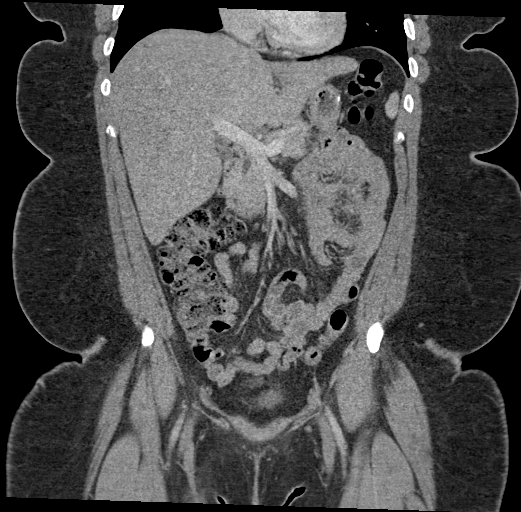
[im 71/160  soft-tissue]
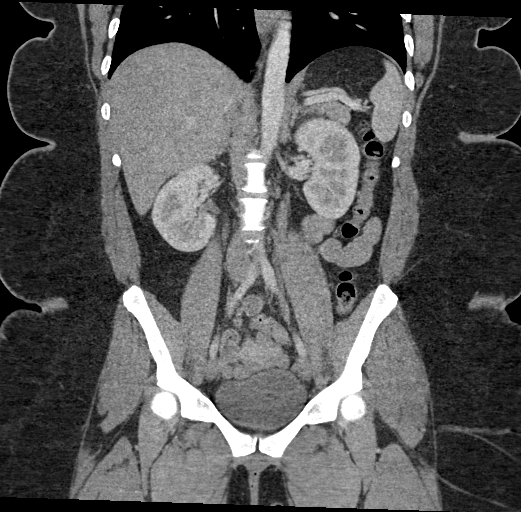
[im 89/160  soft-tissue]
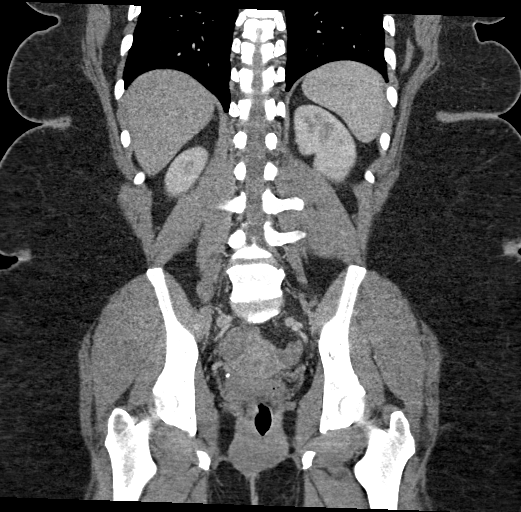

[17 of 46 positions shown; findings below may reference images not displayed]

RADIATION DOSE REDUCTION: This exam was performed according to the
departmental dose-optimization program which includes automated
exposure control, adjustment of the mA and/or kV according to
patient size and/or use of iterative reconstruction technique.

CONTRAST:  100mL OMNIPAQUE IOHEXOL 300 MG/ML  SOLN
FINDINGS: Lower chest: Included lung bases are clear.  Heart size is normal.

Hepatobiliary: No focal liver abnormality is seen. No gallstones,
gallbladder wall thickening, or biliary dilatation.

Pancreas: Unremarkable. No pancreatic ductal dilatation or
surrounding inflammatory changes.

Spleen: Normal in size without focal abnormality.

Adrenals/Urinary Tract: Unremarkable adrenal glands. Kidneys enhance
symmetrically without focal lesion, stone, or hydronephrosis.
Ureters are nondilated. Urinary bladder appears unremarkable.

Stomach/Bowel: Postsurgical changes from prior sleeve gastrectomy.
Appendix appears normal. No evidence of bowel wall thickening,
distention, or inflammatory changes.

Vascular/Lymphatic: No significant vascular findings are present. No
enlarged abdominal or pelvic lymph nodes.

Reproductive: Uterus and bilateral adnexa are unremarkable.

Other: No free fluid. No abdominopelvic fluid collection. No
pneumoperitoneum. No abdominal wall hernia.

Musculoskeletal: No acute or significant osseous findings.
IMPRESSION: No acute abdominopelvic findings.

## 2023-10-17 ENCOUNTER — Encounter (HOSPITAL_COMMUNITY): Payer: Self-pay | Admitting: *Deleted
